# Patient Record
Sex: Female | Born: 1975 | Race: White | Hispanic: No | Marital: Married | Smoking: Current some day smoker
Health system: Southern US, Community
[De-identification: ages and names within clinical notes are randomized; demographics above are authoritative.]

## PROBLEM LIST (undated history)

## (undated) DIAGNOSIS — E119 Type 2 diabetes mellitus without complications: Secondary | ICD-10-CM

## (undated) DIAGNOSIS — IMO0002 Reserved for concepts with insufficient information to code with codable children: Secondary | ICD-10-CM

## (undated) DIAGNOSIS — M797 Fibromyalgia: Secondary | ICD-10-CM

## (undated) DIAGNOSIS — M5430 Sciatica, unspecified side: Secondary | ICD-10-CM

## (undated) DIAGNOSIS — M329 Systemic lupus erythematosus, unspecified: Secondary | ICD-10-CM

## (undated) HISTORY — PX: CHOLECYSTECTOMY: SHX55

## (undated) HISTORY — PX: TONSILLECTOMY: SUR1361

## (undated) HISTORY — PX: ABDOMINAL HYSTERECTOMY: SHX81

## (undated) HISTORY — PX: TUBAL LIGATION: SHX77

---

## 2016-02-13 ENCOUNTER — Emergency Department (HOSPITAL_BASED_OUTPATIENT_CLINIC_OR_DEPARTMENT_OTHER): Payer: 59

## 2016-02-13 ENCOUNTER — Encounter (HOSPITAL_BASED_OUTPATIENT_CLINIC_OR_DEPARTMENT_OTHER): Payer: Self-pay | Admitting: *Deleted

## 2016-02-13 ENCOUNTER — Emergency Department (HOSPITAL_BASED_OUTPATIENT_CLINIC_OR_DEPARTMENT_OTHER)
Admission: EM | Admit: 2016-02-13 | Discharge: 2016-02-13 | Disposition: A | Discharge status: Home / Self Care | Payer: 59 | Attending: Emergency Medicine | Admitting: Emergency Medicine

## 2016-02-13 DIAGNOSIS — F17200 Nicotine dependence, unspecified, uncomplicated: Secondary | ICD-10-CM | POA: Diagnosis not present

## 2016-02-13 DIAGNOSIS — R519 Headache, unspecified: Secondary | ICD-10-CM

## 2016-02-13 DIAGNOSIS — E1165 Type 2 diabetes mellitus with hyperglycemia: Secondary | ICD-10-CM | POA: Diagnosis not present

## 2016-02-13 DIAGNOSIS — Z79899 Other long term (current) drug therapy: Secondary | ICD-10-CM | POA: Diagnosis not present

## 2016-02-13 DIAGNOSIS — Z794 Long term (current) use of insulin: Secondary | ICD-10-CM | POA: Insufficient documentation

## 2016-02-13 DIAGNOSIS — R42 Dizziness and giddiness: Secondary | ICD-10-CM | POA: Diagnosis present

## 2016-02-13 DIAGNOSIS — R739 Hyperglycemia, unspecified: Secondary | ICD-10-CM

## 2016-02-13 DIAGNOSIS — R51 Headache: Secondary | ICD-10-CM

## 2016-02-13 HISTORY — DX: Type 2 diabetes mellitus without complications: E11.9

## 2016-02-13 LAB — CBC WITH DIFFERENTIAL/PLATELET
Basophils Absolute: 0 10*3/uL (ref 0.0–0.1)
Basophils Relative: 0 %
EOS ABS: 0.1 10*3/uL (ref 0.0–0.7)
Eosinophils Relative: 1 %
HEMATOCRIT: 41.3 % (ref 36.0–46.0)
HEMOGLOBIN: 14 g/dL (ref 12.0–15.0)
LYMPHS ABS: 3 10*3/uL (ref 0.7–4.0)
Lymphocytes Relative: 25 %
MCH: 28.8 pg (ref 26.0–34.0)
MCHC: 33.9 g/dL (ref 30.0–36.0)
MCV: 85 fL (ref 78.0–100.0)
Monocytes Absolute: 0.6 10*3/uL (ref 0.1–1.0)
Monocytes Relative: 5 %
NEUTROS ABS: 8 10*3/uL — AB (ref 1.7–7.7)
Neutrophils Relative %: 69 %
Platelets: 343 10*3/uL (ref 150–400)
RBC: 4.86 MIL/uL (ref 3.87–5.11)
RDW: 13.4 % (ref 11.5–15.5)
WBC: 11.7 10*3/uL — AB (ref 4.0–10.5)

## 2016-02-13 LAB — BASIC METABOLIC PANEL
Anion gap: 11 (ref 5–15)
BUN: 11 mg/dL (ref 6–20)
CHLORIDE: 98 mmol/L — AB (ref 101–111)
CO2: 24 mmol/L (ref 22–32)
Calcium: 9.4 mg/dL (ref 8.9–10.3)
Creatinine, Ser: 0.56 mg/dL (ref 0.44–1.00)
GFR calc non Af Amer: >60 mL/min (ref >60)
Glucose, Bld: 395 mg/dL — ABNORMAL HIGH (ref 65–99)
POTASSIUM: 3.7 mmol/L (ref 3.5–5.1)
SODIUM: 133 mmol/L — AB (ref 135–145)

## 2016-02-13 LAB — URINALYSIS, ROUTINE W REFLEX MICROSCOPIC
Bilirubin Urine: NEGATIVE
Glucose, UA: >1000 mg/dL — AB
Hgb urine dipstick: NEGATIVE
Ketones, ur: NEGATIVE mg/dL
LEUKOCYTES UA: NEGATIVE
Nitrite: NEGATIVE
PROTEIN: NEGATIVE mg/dL
Specific Gravity, Urine: 1.043 — ABNORMAL HIGH (ref 1.005–1.030)
pH: 5.5 (ref 5.0–8.0)

## 2016-02-13 LAB — URINE MICROSCOPIC-ADD ON

## 2016-02-13 LAB — CBG MONITORING, ED
GLUCOSE-CAPILLARY: 400 mg/dL — AB (ref 65–99)
GLUCOSE-CAPILLARY: 269 mg/dL — AB (ref 65–99)

## 2016-02-13 LAB — TROPONIN I

## 2016-02-13 MED ORDER — PROMETHAZINE HCL 25 MG/ML IJ SOLN
25.0000 mg | Freq: Once | INTRAMUSCULAR | Status: AC
  Administered 2016-02-13: 25 mg via INTRAVENOUS
  Filled 2016-02-13: qty 1

## 2016-02-13 MED ORDER — INSULIN ASPART 100 UNIT/ML ~~LOC~~ SOLN
10.0000 [IU] | Freq: Once | SUBCUTANEOUS | Status: AC
  Administered 2016-02-13: 10 [IU] via SUBCUTANEOUS
  Filled 2016-02-13: qty 1

## 2016-02-13 MED ORDER — SODIUM CHLORIDE 0.9 % IV BOLUS (SEPSIS)
1000.0000 mL | Freq: Once | INTRAVENOUS | Status: AC
  Administered 2016-02-13: 1000 mL via INTRAVENOUS

## 2016-02-13 NOTE — ED Provider Notes (Signed)
CSN: [id number]     Arrival date & time 02/13/16  1232 History   First MD Initiated Contact with Patient 02/13/16 1318     Chief Complaint  Patient presents with  . Weakness     (Consider location/radiation/quality/duration/timing/severity/associated sxs/prior Treatment) HPI   40 year old female with history of insulin-dependent diabetes presenting with complaint of dizziness and weakness. Patient states today while at work she developed an acute onset of sharp throbbing left-sided headache, lightheadedness, dizziness, break out into sweats, felt nauseous and feeling flushed throughout her body. Symptoms lasting for approximately 10 minutes, improved but not fully resolved. She feels shaky. She felt as if she was going to pass out. She recall heart beating fast.  Patient recall recent changes in her medication including discontinue Neurontin and started on Lyrica. She took her first dose of Lyrica this morning. She takes it for her diabetic neuropathy. She also mentioned that her glucose has not been well-controlled. She usually takes 38 units of insulin at nighttime but her doctor recently told her that she would need to take 45 units nightly. She report remote history of headache but no recurrent headaches. She denies having fever, URI symptoms, shortness of breath, productive cough, dysuria, abdominal pain, focal numbness or weakness, or rash. She endorses a mild cough for the past several days but is nonproductive. Patient is an occasional smoker, denies alcohol abuse.  Past Medical History  Diagnosis Date  . Diabetes mellitus without complication [facility](HCC)    Past Surgical History  Procedure Laterality Date  . Abdominal hysterectomy    . Cholecystectomy    . Tubal ligation    . Tonsillectomy     No family history on file. Social History  Substance Use Topics  . Smoking status: Current Some Day Smoker  . Smokeless tobacco: None  . Alcohol Use: No   OB History    No data available      Review of Systems  All other systems reviewed and are negative.     Allergies  Monistat and Morphine and related  Home Medications   Prior to Admission medications   Medication Sig Start Date End Date Taking? Authorizing Provider  ibuprofen (ADVIL,MOTRIN) 800 MG tablet Take 800 mg by mouth every 8 (eight) hours as needed.   Yes Historical Provider, MD  insulin glargine (LANTUS) 100 UNIT/ML injection Inject into the skin at bedtime.   Yes Historical Provider, MD  Pregabalin (LYRICA PO) Take by mouth.   Yes Historical Provider, MD   BP 130/82 mmHg  Pulse 81  Temp(Src) 98 F (36.7 C) (Oral)  Resp 18  Ht 5\' 4"  (1.626 m)  Wt 99.791 kg  BMI 37.74 kg/m2  SpO2 95% Physical Exam  Constitutional: She is oriented to person, place, and time. She appears well-developed and well-nourished. No distress.  Obese Caucasian female laying in bed appears uncomfortable but nontoxic.  HENT:  Head: Atraumatic.  Right Ear: External ear normal.  Left Ear: External ear normal.  Mouth/Throat: Oropharynx is clear and moist.  Eyes: Conjunctivae and EOM are normal. Pupils are equal, round, and reactive to light.  Neck: Normal range of motion. Neck supple.  No nuchal rigidity  Cardiovascular: Normal rate and regular rhythm.   Pulmonary/Chest: Effort normal and breath sounds normal.  Abdominal: Soft. Bowel sounds are normal. She exhibits no distension. There is no tenderness.  Neurological: She is alert and oriented to person, place, and time. She has normal strength. No cranial nerve deficit or sensory deficit. She displays a negative  Romberg sign. Coordination normal. GCS eye subscore is 4. GCS verbal subscore is 5. GCS motor subscore is 6.  Skin: No rash noted.  Psychiatric: She has a normal mood and affect.  Nursing note and vitals reviewed.   ED Course  Procedures (including critical care time) Labs Review Labs Reviewed  CBC WITH DIFFERENTIAL/PLATELET - Abnormal; Notable for the following:     WBC 11.7 (*)    Neutro Abs 8.0 (*)    All other components within normal limits  BASIC METABOLIC PANEL - Abnormal; Notable for the following:    Sodium 133 (*)    Chloride 98 (*)    Glucose, Bld 395 (*)    All other components within normal limits  URINALYSIS, ROUTINE W REFLEX MICROSCOPIC (NOT [facility]) - Abnormal; Notable for the following:    Specific Gravity, Urine 1.043 (*)    Glucose, UA >1000 (*)    All other components within normal limits  URINE MICROSCOPIC-ADD ON - Abnormal; Notable for the following:    Squamous Epithelial / LPF 6-30 (*)    Bacteria, UA FEW (*)    All other components within normal limits  CBG MONITORING, ED - Abnormal; Notable for the following:    Glucose-Capillary 400 (*)    All other components within normal limits  CBG MONITORING, ED - Abnormal; Notable for the following:    Glucose-Capillary 269 (*)    All other components within normal limits  TROPONIN I    Imaging Review Ct Head Wo Contrast  02/13/2016  CLINICAL DATA:  Near syncopal episode today. Dizziness. Increased heart rate. Headache. EXAM: CT HEAD WITHOUT CONTRAST TECHNIQUE: Contiguous axial images were obtained from the base of the skull through the vertex without intravenous contrast. COMPARISON:  None. FINDINGS: No acute cortical infarct, hemorrhage, or mass lesion is present. The ventricles are of normal size. No significant extra-axial fluid collection is evident. The paranasal sinuses and mastoid air cells are clear. The calvarium is intact. The globes and orbits are within normal limits. No significant extracranial soft tissue lesions are present. IMPRESSION: Negative CT of the head. Electronically Signed   By: Marin Robertshristopher  Mattern M.D.   On: 02/13/2016 14:32   I have personally reviewed and evaluated these images and lab results as part of my medical decision-making.   EKG Interpretation   Date/Time:  Friday February 13 2016 12:47:22 EDT Ventricular Rate:  79 PR Interval:    QRS  Duration: 103 QT Interval:  368 QTC Calculation: 422 R Axis:   16 Text Interpretation:  Sinus rhythm EKG WITHIN NORMAL LIMITS No previous  ECGs available Confirmed by LITTLE MD, RACHEL [phone]([id number]) on 02/13/2016 2:51:39  PM      MDM   Final diagnoses:  Bad headache  Hyperglycemia    BP 130/82 mmHg  Pulse 81  Temp(Src) 98 F (36.7 C) (Oral)  Resp 18  Ht 5\' 4"  (1.626 m)  Wt 99.791 kg  BMI 37.74 kg/m2  SpO2 95%   1:57 PM Patient presents with acute onset of headache, dizziness and generalized fatigue. She has no focal neuro deficit on exam. She did not describe thunderclap headache however due to her presenting complaint, I will obtain a screening head CT scan to rule out hemorrhagic event. She has history of diabetes, her glucose is 400 with normal anion gap and electrolytes panels are reassuring. Will give IV fluid and insulin here. We'll perform a cardiac screening tests. Plan to monitor closely.  3:23 PM EKG is unremarkable. Negative troponin. My  suspicion for ACS event is low.  4:03 PM Patient's initial CBG was 400. It has improved to 269 after patient received 10 units of insulin along with 1 L of normal saline. UA shows no signs of ketones or signs of urinary tract infection. Her labs are otherwise reassuring. Her CT scan is unremarkable. EKG without acute ischemic changes.  I encouraged patient to follow-up probably with a primary care provider for further evaluation of her condition. She may need to discuss if Lyrica is appropriate for her as this could potentiate some of her symptoms. I also encouraged patient to increase her  basal insulin dose from 38 to 45 units as previously prescribed by her primary care provider. Patient to return if her condition worsen. Care discussed with Dr. Clarene Duke.   Fayrene Helper, PA-C 02/13/16 1610  Laurence Spates, MD 02/16/16 [phone]

## 2016-02-13 NOTE — ED Notes (Signed)
Pt reports sudden onset of dizziness while at work earlier. Sts that she became nauseated as well. Pt reports she only takes long lasting insulin at night. Reports it was increased today.

## 2016-02-13 NOTE — ED Notes (Signed)
Sudden onset dizziness. She felt like she was going to pass out. Felt her body get hot and her heart beating fast. Headache and nausea on arrival.

## 2016-02-13 NOTE — ED Notes (Signed)
Walked patient in hall to restroom and back. Patient walked with slow and steady gait holding onto IV pole

## 2016-02-13 NOTE — Discharge Instructions (Signed)
Your symptoms will need to be evaluated further by your primary care provider.  Please discuss if Lyrica is right for you.  Your blood sugar is elevated today, increase the dose of your insulin as previously recommended by your doctor.  Return to the ER if your condition worsen or if you have other concerns.   Blood Glucose Monitoring, Adult Monitoring your blood glucose (also know as blood sugar) helps you to manage your diabetes. It also helps you and your health care provider monitor your diabetes and determine how well your treatment plan is working. WHY SHOULD YOU MONITOR YOUR BLOOD GLUCOSE?  It can help you understand how food, exercise, and medicine affect your blood glucose.  It allows you to know what your blood glucose is at any given moment. You can quickly tell if you are having low blood glucose (hypoglycemia) or high blood glucose (hyperglycemia).  It can help you and your health care provider know how to adjust your medicines.  It can help you understand how to manage an illness or adjust medicine for exercise. WHEN SHOULD YOU TEST? Your health care provider will help you decide how often you should check your blood glucose. This may depend on the type of diabetes you have, your diabetes control, or the types of medicines you are taking. Be sure to write down all of your blood glucose readings so that this information can be reviewed with your health care provider. See below for examples of testing times that your health care provider may suggest. Type 1 Diabetes  Test at least 2 times per day if your diabetes is well controlled, if you are using an insulin pump, or if you perform multiple daily injections.  If your diabetes is not well controlled or if you are sick, you may need to test more often.  It is a good idea to also test:  Before every insulin injection.  Before and after exercise.  Between meals and 2 hours after a meal.  Occasionally between 2:00 a.m. and 3:00  a.m. Type 2 Diabetes  If you are taking insulin, test at least 2 times per day. However, it is best to test before every insulin injection.  If you take medicines by mouth (orally), test 2 times a day.  If you are on a controlled diet, test once a day.  If your diabetes is not well controlled or if you are sick, you may need to monitor more often. HOW TO MONITOR YOUR BLOOD GLUCOSE Supplies Needed  Blood glucose meter.  Test strips for your meter. Each meter has its own strips. You must use the strips that go with your own meter.  A pricking needle (lancet).  A device that holds the lancet (lancing device).  A journal or log book to write down your results. Procedure  Wash your hands with soap and water. Alcohol is not preferred.  Prick the side of your finger (not the tip) with the lancet.  Gently milk the finger until a small drop of blood appears.  Follow the instructions that come with your meter for inserting the test strip, applying blood to the strip, and using your blood glucose meter. Other Areas to Get Blood for Testing Some meters allow you to use other areas of your body (other than your finger) to test your blood. These areas are called alternative sites. The most common alternative sites are:  The forearm.  The thigh.  The back area of the lower leg.  The palm of  the hand. The blood flow in these areas is slower. Therefore, the blood glucose values you get may be delayed, and the numbers are different from what you would get from your fingers. Do not use alternative sites if you think you are having hypoglycemia. Your reading will not be accurate. Always use a finger if you are having hypoglycemia. Also, if you cannot feel your lows (hypoglycemia unawareness), always use your fingers for your blood glucose checks. ADDITIONAL TIPS FOR GLUCOSE MONITORING  Do not reuse lancets.  Always carry your supplies with you.  All blood glucose meters have a 24-hour  "hotline" number to call if you have questions or need help.  Adjust (calibrate) your blood glucose meter with a control solution after finishing a few boxes of strips. BLOOD GLUCOSE RECORD KEEPING It is a good idea to keep a daily record or log of your blood glucose readings. Most glucose meters, if not all, keep your glucose records stored in the meter. Some meters come with the ability to download your records to your home computer. Keeping a record of your blood glucose readings is especially helpful if you are wanting to look for patterns. Make notes to go along with the blood glucose readings because you might forget what happened at that exact time. Keeping good records helps you and your health care provider to work together to achieve good diabetes management.    This information is not intended to replace advice given to you by your health care provider. Make sure you discuss any questions you have with your health care provider.   Document Released: 08/05/2003 Document Revised: 08/23/2014 Document Reviewed: 12/25/2012 Elsevier Interactive Patient Education [employer]2016 Else[employer] Inc.

## 2016-03-17 ENCOUNTER — Encounter (HOSPITAL_BASED_OUTPATIENT_CLINIC_OR_DEPARTMENT_OTHER): Payer: Self-pay

## 2016-03-17 ENCOUNTER — Emergency Department (HOSPITAL_BASED_OUTPATIENT_CLINIC_OR_DEPARTMENT_OTHER)
Admission: EM | Admit: 2016-03-17 | Discharge: 2016-03-17 | Disposition: A | Discharge status: Home / Self Care | Payer: 59 | Attending: Physician Assistant | Admitting: Physician Assistant

## 2016-03-17 DIAGNOSIS — R51 Headache: Secondary | ICD-10-CM | POA: Insufficient documentation

## 2016-03-17 DIAGNOSIS — R197 Diarrhea, unspecified: Secondary | ICD-10-CM | POA: Insufficient documentation

## 2016-03-17 DIAGNOSIS — K0889 Other specified disorders of teeth and supporting structures: Secondary | ICD-10-CM | POA: Insufficient documentation

## 2016-03-17 DIAGNOSIS — Z79899 Other long term (current) drug therapy: Secondary | ICD-10-CM | POA: Diagnosis not present

## 2016-03-17 DIAGNOSIS — E119 Type 2 diabetes mellitus without complications: Secondary | ICD-10-CM | POA: Diagnosis not present

## 2016-03-17 DIAGNOSIS — F17200 Nicotine dependence, unspecified, uncomplicated: Secondary | ICD-10-CM | POA: Diagnosis not present

## 2016-03-17 DIAGNOSIS — Z794 Long term (current) use of insulin: Secondary | ICD-10-CM | POA: Diagnosis not present

## 2016-03-17 DIAGNOSIS — R110 Nausea: Secondary | ICD-10-CM | POA: Insufficient documentation

## 2016-03-17 DIAGNOSIS — H9201 Otalgia, right ear: Secondary | ICD-10-CM | POA: Insufficient documentation

## 2016-03-17 HISTORY — DX: Sciatica, unspecified side: M54.30

## 2016-03-17 MED ORDER — ACETAMINOPHEN 325 MG PO TABS
650.0000 mg | ORAL_TABLET | Freq: Once | ORAL | Status: AC
  Administered 2016-03-17: 650 mg via ORAL
  Filled 2016-03-17: qty 2

## 2016-03-17 MED ORDER — NEOMYCIN-POLYMYXIN-HC 1 % OT SOLN: 3.0000 [drp] | Freq: Four times a day (QID) | OTIC | 0 refills | Status: AC

## 2016-03-17 MED ORDER — AMOXICILLIN-POT CLAVULANATE 875-125 MG PO TABS: 1.0000 | ORAL_TABLET | Freq: Two times a day (BID) | ORAL | 0 refills | Status: DC

## 2016-03-17 NOTE — ED Notes (Signed)
Pa  at bedside. 

## 2016-03-17 NOTE — ED Triage Notes (Signed)
C/o right earache x 3 days-also c/o "filling fell out of tooth" today while eating

## 2016-03-17 NOTE — Discharge Instructions (Signed)
Read the information below.   You are being prescribed an antibiotic drop for your ear. Take as directed. You are being prescribed an oral antibiotic for your broken filling to prevent infection. Take as directed.  You can take tylenol or motrin for pain relief.  It is important that you follow up with a dentist ASAP.  Use the prescribed medication as directed.   I have also provided the contact information for a primary care doctor if you do not have one. Be sure to call and schedule a follow up appointment.  Please discuss all new medications with your pharmacist.  You may return to the Emergency Department at any time for worsening condition or any new symptoms that concern you. Return to the ED if you develop fever, difficulty swallowing, difficulty breathing.

## 2016-03-17 NOTE — ED Provider Notes (Signed)
[facility] Provider Note   CSN: [id number] Arrival date & time: 03/17/16  1255  First Provider Contact:  First MD Initiated Contact with Patient 03/17/16 1325        History   Chief Complaint Chief Complaint  Patient presents with  . Otalgia    HPI Jennifer Beltran is a 40 y.o. female.  Jennifer Beltran is a 41 y.o. female  with history of diabetes and migraines resents to ED with 2 complaints. Patient states she had right otalgia 3 days. Describes it as a stabbing sensation in her right ear. She has tried using antibiotic drops that she had from an earlier episode of otitis externa with no relief. She reports swimming in a pool approximately one week ago. She denies any trauma to ear. She has associated headache. No URI symptoms. No watery eyes. No cough. She also reports that her filling on her right lower molar broke today after eating an apple. She endorses dental pain. She is tried taking her pain medication with minimal relief. She denies fever, trouble swallowing, trouble breathing.      Past Medical History:  Diagnosis Date  . Diabetes mellitus without complication (HCC)   . Sciatica     There are no active problems to display for this patient.   Past Surgical History:  Procedure Laterality Date  . ABDOMINAL HYSTERECTOMY    . CHOLECYSTECTOMY    . TONSILLECTOMY    . TUBAL LIGATION      OB History    No data available       Home Medications    Prior to Admission medications   Medication Sig Start Date End Date Taking? Authorizing Provider  gabapentin (NEURONTIN) 600 MG tablet Take 1,200 mg by mouth 3 (three) times daily.   Yes Historical Provider, MD  glipiZIDE (GLUCOTROL) 5 MG tablet Take by mouth daily before breakfast.   Yes Historical Provider, MD  amoxicillin-clavulanate (AUGMENTIN) 875-125 MG tablet Take 1 tablet by mouth every 12 (twelve) hours. 03/17/16   Lona Kettle, PA-C  ibuprofen (ADVIL,MOTRIN) 800 MG tablet Take 800 mg by  mouth every 8 (eight) hours as needed.    Historical Provider, MD  insulin glargine (LANTUS) 100 UNIT/ML injection Inject into the skin at bedtime.    Historical Provider, MD  NEOMYCIN-POLYMYXIN-HYDROCORTISONE (CORTISPORIN) 1 % SOLN otic solution Place 3 drops into the right ear 4 (four) times daily. For 7 days 03/17/16 03/24/16  Lona Kettle, PA-C    Family History No family history on file.  Social History Social History  Substance Use Topics  . Smoking status: Current Some Day Smoker  . Smokeless tobacco: Never Used  . Alcohol use No     Allergies   Monistat [tioconazole] and Morphine and related   Review of Systems Review of Systems  Constitutional: Negative for fever.  HENT: Positive for dental problem and ear pain. Negative for congestion, postnasal drip, rhinorrhea and trouble swallowing.   Eyes: Negative for discharge.  Respiratory: Negative for cough and shortness of breath.   Gastrointestinal: Positive for diarrhea and nausea. Negative for abdominal pain and vomiting.  Neurological: Positive for headaches.     Physical Exam Updated Vital Signs BP 129/80 (BP Location: Left Arm)   Pulse 90   Temp 98.4 F (36.9 C) (Oral)   Resp 18   Ht 5\' 4"  (1.626 m)   Wt 103 kg   SpO2 95%   BMI 38.96 kg/m   Physical Exam  Constitutional: She appears well-developed and  well-nourished. No distress.  HENT:  Head: Normocephalic and atraumatic.  Right Ear: No lacerations. There is tenderness. No foreign bodies. No mastoid tenderness. Tympanic membrane is scarred. Tympanic membrane is not injected, not perforated and not erythematous. No middle ear effusion. No hemotympanum.  Left Ear: Tympanic membrane, external ear and ear canal normal. No mastoid tenderness.  Nose: Rhinorrhea present.  Mouth/Throat: Uvula is midline, oropharynx is clear and moist and mucous membranes are normal. No oral lesions. No trismus in the jaw. Abnormal dentition. Dental caries present. No uvula  swelling. No oropharyngeal exudate, posterior oropharyngeal edema or posterior oropharyngeal erythema.  TTP of right tragus and pinna. Superficial abrasion and mild erythema and scant white cerumen noted in external canal of right ear. Right TM scarring noted, no erythema, injection, or bulging. No erythema, warmth, or tenderness of mastoid noted.  Edentulous. Partial dental filling and tooth at #30 present. No movement of tooth, no avulsion noted. Partial dental filling and tooth present at #13, no movement of tooth, no avulsion. No gingival swelling, redness, fluctuance, or discharge noted. No obvious abscess.   Eyes: Conjunctivae are normal. Pupils are equal, round, and reactive to light. Right eye exhibits no discharge. Left eye exhibits no discharge. No scleral icterus.  Neck: Normal range of motion.  Cardiovascular: Normal rate.   Pulmonary/Chest: Effort normal. No stridor. No respiratory distress.  Abdominal: Soft. There is no tenderness. There is no guarding.  Neurological: She is alert.  Skin: Skin is warm and dry. She is not diaphoretic.  Psychiatric: She has a normal mood and affect. Her behavior is normal.     ED Treatments / Results  Labs (all labs ordered are listed, but only abnormal results are displayed) Labs Reviewed - No data to display  EKG  EKG Interpretation None       Radiology No results found.  Procedures Procedures (including critical care time)  Medications Ordered in ED Medications  acetaminophen (TYLENOL) tablet 650 mg (650 mg Oral Given 03/17/16 1347)     Initial Impression / Assessment and Plan / ED Course  I have reviewed the triage vital signs and the nursing notes.  Pertinent labs & imaging results that were available during my care of the patient were reviewed by me and considered in my medical decision making (see chart for details).  Clinical Course    Patient is afebrile and non-toxic appearing in obvious discomfort. Physical exam  remarkable for TTP of right tragus and pinna, with mild erythema and scant white cerumen in ear canal. With h/o swimming in a pool and DM along with physical exam findings will treat for otitis externa. No canal occlusion. Exam non-concerning for mastoiditis, cellulitis, or malignant OE. Rx polymixin drops, d/c ciprodex. Follow up with PCP if symptoms do note improve in 2-3 days.   Regarding dentalgia - physical exam remarkable for partial dental filling and tooth at #30 and #13 missing. No avulsion noted. No trismus. No oral swelling. No gingival swelling, redness, or fluctuance. No drainable abscess present. Low suspicion for soft tissue infection. Will treat with PO ABX for prophylaxis against infection. Dental resources provided and encouraged follow up with dentist ASAP.    Discussed symptomatic management to include motrin or tylenol for pain relief. Return precautions discussed. Patient voiced understanding and is agreeable.     Final Clinical Impressions(s) / ED Diagnoses   Final diagnoses:  Otalgia, right  Dentalgia    New Prescriptions Discharge Medication List as of 03/17/2016  2:02 PM  START taking these medications   Details  amoxicillin-clavulanate (AUGMENTIN) 875-125 MG tablet Take 1 tablet by mouth every 12 (twelve) hours., Starting Wed 03/17/2016, Print    NEOMYCIN-POLYMYXIN-HYDROCORTISONE (CORTISPORIN) 1 % SOLN otic solution Place 3 drops into the right ear 4 (four) times daily. For 7 days, Starting Wed 03/17/2016, Until Wed 03/24/2016, Print         Lona Kettle, [place] 03/17/16 2043    Courteney Randall An, MD 03/18/16 [phone]

## 2016-07-24 ENCOUNTER — Encounter (HOSPITAL_BASED_OUTPATIENT_CLINIC_OR_DEPARTMENT_OTHER): Payer: Self-pay | Admitting: Emergency Medicine

## 2016-07-24 ENCOUNTER — Emergency Department (HOSPITAL_BASED_OUTPATIENT_CLINIC_OR_DEPARTMENT_OTHER)
Admission: EM | Admit: 2016-07-24 | Discharge: 2016-07-24 | Disposition: A | Discharge status: Home / Self Care | Payer: 59 | Attending: Emergency Medicine | Admitting: Emergency Medicine

## 2016-07-24 DIAGNOSIS — E119 Type 2 diabetes mellitus without complications: Secondary | ICD-10-CM | POA: Diagnosis not present

## 2016-07-24 DIAGNOSIS — Z79899 Other long term (current) drug therapy: Secondary | ICD-10-CM | POA: Insufficient documentation

## 2016-07-24 DIAGNOSIS — N3000 Acute cystitis without hematuria: Secondary | ICD-10-CM

## 2016-07-24 DIAGNOSIS — Z794 Long term (current) use of insulin: Secondary | ICD-10-CM | POA: Insufficient documentation

## 2016-07-24 DIAGNOSIS — F17200 Nicotine dependence, unspecified, uncomplicated: Secondary | ICD-10-CM | POA: Insufficient documentation

## 2016-07-24 DIAGNOSIS — K0889 Other specified disorders of teeth and supporting structures: Secondary | ICD-10-CM

## 2016-07-24 LAB — BASIC METABOLIC PANEL
Anion gap: 7 (ref 5–15)
BUN: 8 mg/dL (ref 6–20)
CALCIUM: 8.8 mg/dL — AB (ref 8.9–10.3)
CO2: 27 mmol/L (ref 22–32)
CREATININE: 0.44 mg/dL (ref 0.44–1.00)
Chloride: 100 mmol/L — ABNORMAL LOW (ref 101–111)
GFR calc non Af Amer: >60 mL/min (ref >60)
Glucose, Bld: 258 mg/dL — ABNORMAL HIGH (ref 65–99)
Potassium: 3.4 mmol/L — ABNORMAL LOW (ref 3.5–5.1)
SODIUM: 134 mmol/L — AB (ref 135–145)

## 2016-07-24 LAB — CBC WITH DIFFERENTIAL/PLATELET
BASOS PCT: 0 %
Basophils Absolute: 0 10*3/uL (ref 0.0–0.1)
EOS ABS: 0.1 10*3/uL (ref 0.0–0.7)
EOS PCT: 1 %
HCT: 41.7 % (ref 36.0–46.0)
HEMOGLOBIN: 14.1 g/dL (ref 12.0–15.0)
Lymphocytes Relative: 21 %
Lymphs Abs: 1.7 10*3/uL (ref 0.7–4.0)
MCH: 28.6 pg (ref 26.0–34.0)
MCHC: 33.8 g/dL (ref 30.0–36.0)
MCV: 84.6 fL (ref 78.0–100.0)
MONOS PCT: 7 %
Monocytes Absolute: 0.6 10*3/uL (ref 0.1–1.0)
NEUTROS PCT: 71 %
Neutro Abs: 5.6 10*3/uL (ref 1.7–7.7)
PLATELETS: 278 10*3/uL (ref 150–400)
RBC: 4.93 MIL/uL (ref 3.87–5.11)
RDW: 13.2 % (ref 11.5–15.5)
WBC: 8 10*3/uL (ref 4.0–10.5)

## 2016-07-24 LAB — URINALYSIS, ROUTINE W REFLEX MICROSCOPIC
BILIRUBIN URINE: NEGATIVE
Glucose, UA: >=500 mg/dL — AB
HGB URINE DIPSTICK: NEGATIVE
KETONES UR: 15 mg/dL — AB
Leukocytes, UA: NEGATIVE
NITRITE: NEGATIVE
PROTEIN: NEGATIVE mg/dL
SPECIFIC GRAVITY, URINE: 1.038 — AB (ref 1.005–1.030)
pH: 6 (ref 5.0–8.0)

## 2016-07-24 LAB — URINALYSIS, MICROSCOPIC (REFLEX)

## 2016-07-24 LAB — CBG MONITORING, ED: Glucose-Capillary: 263 mg/dL — ABNORMAL HIGH (ref 65–99)

## 2016-07-24 MED ORDER — SODIUM CHLORIDE 0.9 % IV BOLUS (SEPSIS)
1000.0000 mL | Freq: Once | INTRAVENOUS | Status: AC
  Administered 2016-07-24: 1000 mL via INTRAVENOUS

## 2016-07-24 MED ORDER — CLINDAMYCIN HCL 150 MG PO CAPS: 300.0000 mg | ORAL_CAPSULE | Freq: Three times a day (TID) | ORAL | 0 refills | Status: DC

## 2016-07-24 MED ORDER — TRAMADOL HCL 50 MG PO TABS: 50.0000 mg | ORAL_TABLET | Freq: Four times a day (QID) | ORAL | 0 refills | Status: DC | PRN

## 2016-07-24 MED ORDER — CEPHALEXIN 500 MG PO CAPS: 500.0000 mg | ORAL_CAPSULE | Freq: Two times a day (BID) | ORAL | 0 refills | Status: DC

## 2016-07-24 MED ORDER — ONDANSETRON HCL 4 MG/2ML IJ SOLN
4.0000 mg | Freq: Once | INTRAMUSCULAR | Status: AC
Start: 2016-07-24 — End: 2016-07-24
  Administered 2016-07-24: 4 mg via INTRAVENOUS
  Filled 2016-07-24: qty 2

## 2016-07-24 MED ORDER — HYDROCODONE-ACETAMINOPHEN 5-325 MG PO TABS: 2.0000 | ORAL_TABLET | ORAL | 0 refills | Status: DC | PRN

## 2016-07-24 MED ORDER — CLINDAMYCIN HCL 150 MG PO CAPS
300.0000 mg | ORAL_CAPSULE | Freq: Once | ORAL | Status: AC
  Administered 2016-07-24: 300 mg via ORAL
  Filled 2016-07-24: qty 2

## 2016-07-24 MED ORDER — CEPHALEXIN 250 MG PO CAPS
500.0000 mg | ORAL_CAPSULE | Freq: Once | ORAL | Status: AC
  Administered 2016-07-24: 500 mg via ORAL
  Filled 2016-07-24: qty 2

## 2016-07-24 MED ORDER — KETOROLAC TROMETHAMINE 30 MG/ML IJ SOLN
15.0000 mg | Freq: Once | INTRAMUSCULAR | Status: AC
Start: 2016-07-24 — End: 2016-07-24
  Administered 2016-07-24: 15 mg via INTRAVENOUS
  Filled 2016-07-24: qty 1

## 2016-07-24 NOTE — ED Notes (Signed)
Pt teaching provided on medications that may cause drowsiness. Pt instructed not to drive or operate heavy machinery while taking the prescribed medication. Pt verbalized understanding.   

## 2016-07-24 NOTE — ED Provider Notes (Signed)
[facility] Provider Note   CSN: [id number] Arrival date & time: 07/24/16  1750  By signing my name below, I, Octavia Heir, attest that this documentation has been prepared under the direction and in the presence of Rise Mu, PA-C.  Electronically Signed: Octavia Heir, ED Scribe. 07/24/16. 6:31 PM.    History   Chief Complaint Chief Complaint  Patient presents with  . Dental Pain    The history is provided by the patient. No language interpreter was used.   HPI Comments: Jennifer Beltran is a 40 y.o. female who has a PMhx of DM presents to the Emergency Department complaining of constant, gradual worsening, severe right dental pain x 5 days. She reports associated headache, nausea, fever (tmax 101), chills, pain with swallowing, and right sided facial swelling. Pt was seen at [facility] ED on 12/07 for the same pain and treated for a dental abscess with azithromycin and hydrocodone. She has been taking the medications prescribed with minimal relief. She has taken goodie powders for her headache and fever with relief. Pt also notes that her CBG has been higher since she has not been feeling well. States her bs this am was 240's.. Pt is on 45 mL of nighttime Lantus. She denies vision changes, neck stiffness, trismus, Lightheadedness, dizziness, chest pain, shortness of breath, abdominal pain, nausea, emesis, urinary symptoms, change in bowel habits, numbness/tingling  Past Medical History:  Diagnosis Date  . Diabetes mellitus without complication (HCC)   . Sciatica     There are no active problems to display for this patient.   Past Surgical History:  Procedure Laterality Date  . ABDOMINAL HYSTERECTOMY    . CHOLECYSTECTOMY    . TONSILLECTOMY    . TUBAL LIGATION      OB History    No data available       Home Medications    Prior to Admission medications   Medication Sig Start Date End Date Taking? Authorizing Provider  amoxicillin-clavulanate  (AUGMENTIN) 875-125 MG tablet Take 1 tablet by mouth every 12 (twelve) hours. 03/17/16   Lona Kettle, PA-C  gabapentin (NEURONTIN) 600 MG tablet Take 1,200 mg by mouth 3 (three) times daily.    Historical Provider, MD  glipiZIDE (GLUCOTROL) 5 MG tablet Take by mouth daily before breakfast.    Historical Provider, MD  ibuprofen (ADVIL,MOTRIN) 800 MG tablet Take 800 mg by mouth every 8 (eight) hours as needed.    Historical Provider, MD  insulin glargine (LANTUS) 100 UNIT/ML injection Inject into the skin at bedtime.    Historical Provider, MD    Family History History reviewed. No pertinent family history.  Social History Social History  Substance Use Topics  . Smoking status: Current Some Day Smoker  . Smokeless tobacco: Never Used  . Alcohol use No     Allergies   Monistat [tioconazole]; Morphine and related; and Penicillins   Review of Systems Review of Systems  Constitutional: Positive for chills and fever.  HENT: Positive for dental problem and facial swelling. Negative for sore throat, trouble swallowing and voice change.   Respiratory: Negative for shortness of breath.   Gastrointestinal: Positive for nausea. Negative for abdominal pain and vomiting.  Musculoskeletal: Negative for neck pain and neck stiffness.  Neurological: Positive for headaches. Negative for dizziness, syncope, light-headedness and numbness.  All other systems reviewed and are negative.    Physical Exam Updated Vital Signs BP 116/71 (BP Location: Right Arm)   Pulse 96   Temp 98.3  F (36.8 C) (Oral)   Resp 18   Ht 5\' 4"  (1.626 m)   Wt 230 lb (104.3 kg)   SpO2 94%   BMI 39.48 kg/m   Physical Exam  Constitutional: She is oriented to person, place, and time. She appears well-developed and well-nourished. No distress.  Appears to be in mild discomfort due to pain. Patient speaking in complete sentences and maintain her secretions. Patient appears tearful and anxious   HENT:  Head:  Normocephalic and atraumatic.  Right Ear: Tympanic membrane, external ear and ear canal normal.  Left Ear: Tympanic membrane, external ear and ear canal normal.  Nose: Nose normal.  Mouth/Throat: Uvula is midline, oropharynx is clear and moist and mucous membranes are normal.    Mild soft facial swelling to right lower jaw. No erythema or warmth. No sublingual or submandibular swelling. The area is not indurated or fluctuant. No trismus noted. Oropharynx is clear.  Eyes: Conjunctivae are normal. Pupils are equal, round, and reactive to light. Right eye exhibits no discharge. Left eye exhibits no discharge. No scleral icterus.  Neck: Normal range of motion. Neck supple. No thyromegaly present.  Full range of motion without any neck stiffness. No cervical lymphadenopathy.  Cardiovascular: Normal rate, regular rhythm, normal heart sounds and intact distal pulses.  Exam reveals no gallop and no friction rub.   No murmur heard. Pulmonary/Chest: Effort normal and breath sounds normal. No respiratory distress.  Abdominal: Soft. Bowel sounds are normal. She exhibits no distension. There is no tenderness. There is no rebound and no guarding.  Musculoskeletal: Normal range of motion.  Lymphadenopathy:    She has no cervical adenopathy.  Neurological: She is alert and oriented to person, place, and time.  Skin: Skin is warm and dry. Capillary refill takes less than 2 seconds.  Nursing note and vitals reviewed.    ED Treatments / Results  DIAGNOSTIC STUDIES: Oxygen Saturation is 94% on RA, adequate by my interpretation.  COORDINATION OF CARE:  6:29 PM Discussed treatment plan with pt at bedside and pt agreed to plan.  Labs (all labs ordered are listed, but only abnormal results are displayed) Labs Reviewed  BASIC METABOLIC PANEL - Abnormal; Notable for the following:       Result Value   Sodium 134 (*)    Potassium 3.4 (*)    Chloride 100 (*)    Glucose, Bld 258 (*)    Calcium 8.8 (*)     All other components within normal limits  URINALYSIS, ROUTINE W REFLEX MICROSCOPIC - Abnormal; Notable for the following:    Specific Gravity, Urine 1.038 (*)    Glucose, UA >=500 (*)    Ketones, ur 15 (*)    All other components within normal limits  URINALYSIS, MICROSCOPIC (REFLEX) - Abnormal; Notable for the following:    Bacteria, UA MANY (*)    Squamous Epithelial / LPF 6-30 (*)    All other components within normal limits  CBG MONITORING, ED - Abnormal; Notable for the following:    Glucose-Capillary 263 (*)    All other components within normal limits  URINE CULTURE  CBC WITH DIFFERENTIAL/PLATELET    EKG  EKG Interpretation None       Radiology No results found.  Procedures Procedures (including critical care time)  Medications Ordered in ED Medications  sodium chloride 0.9 % bolus 1,000 mL (0 mLs Intravenous Stopped 07/24/16 1945)  ketorolac (TORADOL) 30 MG/ML injection 15 mg (15 mg Intravenous Given 07/24/16 1946)  ondansetron (ZOFRAN) injection  4 mg (4 mg Intravenous Given 07/24/16 2021)  sodium chloride 0.9 % bolus 1,000 mL (0 mLs Intravenous Stopped 07/24/16 2108)  clindamycin (CLEOCIN) capsule 300 mg (300 mg Oral Given 07/24/16 2122)  cephALEXin (KEFLEX) capsule 500 mg (500 mg Oral Given 07/24/16 2122)     Initial Impression / Assessment and Plan / ED Course  I have reviewed the triage vital signs and the nursing notes.  Pertinent labs & imaging results that were available during my care of the patient were reviewed by me and considered in my medical decision making (see chart for details).  Clinical Course   Patient presents to the ED today with right dental pain and right facial swelling. Patient was seen in the ED approximately 3 days ago and given a prescription for azithromycin for her dental pain and told to follow-up with a dentist. She has been unable to do so. Return to the ED for facial swelling and worsening pain. Patient states that her blood sugars  have been running in the 240s. Here her blood sugar was 263. Anon gap is normal. Only a small amount ketones were noted in urine. Patient denies any vomiting or abd pain. Low suspicion for DKA. Given to liters of fluids. Patient is without any leukocytosis. Urine with many bacteria and squamous epithelia. No nitrites or leukocytes. Patient endorses urgency and frequency. Feel this is contaminated sample, however given patient urinary symptoms will treat with with abx. Culture pending. All other labs are unremarkable. Patient with toothache.  No gross abscess.  Exam unconcerning for Ludwig's angina or spread of infection. Will switch abx to clindamycin from z pack and continue pain medicine. Urged patient to follow-up with dentist and have given a dental referral guide. Pt is hemodynamically stable, in NAD, & able to ambulate in the ED. Pain has been managed & has no complaints prior to dc. Pt is comfortable with above plan and is stable for discharge at this time. All questions were answered prior to disposition. Strict return precautions for f/u to the ED were discussed including worsening swelling or fevers. Patient was seen an examined by Dr. Criss AlvineGoldston who is agreeable to the above plan.   I personally performed the services described in this documentation, which was scribed in my presence. The recorded information has been reviewed and is accurate.  Final Clinical Impressions(s) / ED Diagnoses   Final diagnoses:  Pain, dental  Acute cystitis without hematuria    New Prescriptions New Prescriptions   No medications on file     Rise MuKenneth T Angell Pincock, PA-C 07/26/16 1459    Pricilla LovelessScott Goldston, MD 07/26/16 1557

## 2016-07-24 NOTE — ED Triage Notes (Signed)
Patient reports that she was at the MD's wed for a right sided dental pain. The patient reports that she is unable to eat or drink now and is having a possible high blood sugar.

## 2016-07-24 NOTE — ED Notes (Signed)
Pt is being treated for dental abscess with Z-pak. Pt is on day 3 of Z-pak. Pt states symptoms have become worse and now has facial swelling, nausea, and unable to eat.

## 2016-07-24 NOTE — Discharge Instructions (Signed)
Please take the clindamycin 3 times a day for your dental abscess. You may also take the keflex 2 times per day for urinary tract infection. Will culture your urine and results will be called to you in 24-48 hours if positive. You make take the pain medicine as needed. You may also take motrin for pain. Return to the ED if you develop worsening swelling, difficulty with swallowing or breathing or fevers. Please follow up with a dentist this week. I have given you a dental resource guide.

## 2016-07-26 LAB — URINE CULTURE

## 2016-08-30 ENCOUNTER — Emergency Department (HOSPITAL_BASED_OUTPATIENT_CLINIC_OR_DEPARTMENT_OTHER)
Admission: EM | Admit: 2016-08-30 | Discharge: 2016-08-30 | Disposition: A | Discharge status: Home / Self Care | Payer: 59 | Attending: Emergency Medicine | Admitting: Emergency Medicine

## 2016-08-30 ENCOUNTER — Emergency Department (HOSPITAL_BASED_OUTPATIENT_CLINIC_OR_DEPARTMENT_OTHER): Payer: 59

## 2016-08-30 ENCOUNTER — Encounter (HOSPITAL_BASED_OUTPATIENT_CLINIC_OR_DEPARTMENT_OTHER): Payer: Self-pay | Admitting: *Deleted

## 2016-08-30 ENCOUNTER — Other Ambulatory Visit: Payer: Self-pay | Admitting: Emergency Medicine

## 2016-08-30 DIAGNOSIS — Z794 Long term (current) use of insulin: Secondary | ICD-10-CM | POA: Diagnosis not present

## 2016-08-30 DIAGNOSIS — Z79899 Other long term (current) drug therapy: Secondary | ICD-10-CM | POA: Diagnosis not present

## 2016-08-30 DIAGNOSIS — R197 Diarrhea, unspecified: Secondary | ICD-10-CM | POA: Insufficient documentation

## 2016-08-30 DIAGNOSIS — F17200 Nicotine dependence, unspecified, uncomplicated: Secondary | ICD-10-CM | POA: Diagnosis not present

## 2016-08-30 DIAGNOSIS — E119 Type 2 diabetes mellitus without complications: Secondary | ICD-10-CM | POA: Insufficient documentation

## 2016-08-30 DIAGNOSIS — R310 Gross hematuria: Secondary | ICD-10-CM

## 2016-08-30 DIAGNOSIS — N10 Acute tubulo-interstitial nephritis: Secondary | ICD-10-CM | POA: Insufficient documentation

## 2016-08-30 DIAGNOSIS — R319 Hematuria, unspecified: Secondary | ICD-10-CM | POA: Diagnosis present

## 2016-08-30 HISTORY — DX: Systemic lupus erythematosus, unspecified: M32.9

## 2016-08-30 HISTORY — DX: Reserved for concepts with insufficient information to code with codable children: IMO0002

## 2016-08-30 LAB — CBC WITH DIFFERENTIAL/PLATELET
Basophils Absolute: 0 10*3/uL (ref 0.0–0.1)
Basophils Relative: 0 %
Eosinophils Absolute: 0.1 10*3/uL (ref 0.0–0.7)
Eosinophils Relative: 1 %
HCT: 39.6 % (ref 36.0–46.0)
Hemoglobin: 13.2 g/dL (ref 12.0–15.0)
Lymphocytes Relative: 21 %
Lymphs Abs: 2.7 10*3/uL (ref 0.7–4.0)
MCH: 28 pg (ref 26.0–34.0)
MCHC: 33.3 g/dL (ref 30.0–36.0)
MCV: 84.1 fL (ref 78.0–100.0)
Monocytes Absolute: 0.8 10*3/uL (ref 0.1–1.0)
Monocytes Relative: 6 %
Neutro Abs: 9.3 10*3/uL — ABNORMAL HIGH (ref 1.7–7.7)
Neutrophils Relative %: 72 %
Platelets: 314 10*3/uL (ref 150–400)
RBC: 4.71 MIL/uL (ref 3.87–5.11)
RDW: 13.3 % (ref 11.5–15.5)
WBC: 12.9 10*3/uL — ABNORMAL HIGH (ref 4.0–10.5)

## 2016-08-30 LAB — URINALYSIS, MICROSCOPIC (REFLEX)

## 2016-08-30 LAB — BASIC METABOLIC PANEL
Anion gap: 7 (ref 5–15)
BUN: 12 mg/dL (ref 6–20)
CO2: 26 mmol/L (ref 22–32)
Calcium: 9.3 mg/dL (ref 8.9–10.3)
Chloride: 102 mmol/L (ref 101–111)
Creatinine, Ser: 0.47 mg/dL (ref 0.44–1.00)
GFR calc Af Amer: >60 mL/min (ref >60)
GFR calc non Af Amer: >60 mL/min (ref >60)
Glucose, Bld: 246 mg/dL — ABNORMAL HIGH (ref 65–99)
Potassium: 3.6 mmol/L (ref 3.5–5.1)
Sodium: 135 mmol/L (ref 135–145)

## 2016-08-30 LAB — URINALYSIS, ROUTINE W REFLEX MICROSCOPIC

## 2016-08-30 LAB — PREGNANCY, URINE: Preg Test, Ur: NEGATIVE

## 2016-08-30 MED ORDER — SODIUM CHLORIDE 0.9 % IV BOLUS (SEPSIS)
1000.0000 mL | Freq: Once | INTRAVENOUS | Status: AC
  Administered 2016-08-30: 1000 mL via INTRAVENOUS

## 2016-08-30 MED ORDER — ONDANSETRON HCL 4 MG/2ML IJ SOLN
4.0000 mg | Freq: Once | INTRAMUSCULAR | Status: AC
  Administered 2016-08-30: 4 mg via INTRAVENOUS
  Filled 2016-08-30: qty 2

## 2016-08-30 MED ORDER — CEPHALEXIN 500 MG PO CAPS: 500.0000 mg | ORAL_CAPSULE | Freq: Four times a day (QID) | ORAL | 0 refills | Status: DC

## 2016-08-30 MED ORDER — IBUPROFEN 800 MG PO TABS: 800.0000 mg | ORAL_TABLET | Freq: Three times a day (TID) | ORAL | 0 refills | Status: DC

## 2016-08-30 MED ORDER — CEPHALEXIN 250 MG PO CAPS
500.0000 mg | ORAL_CAPSULE | Freq: Once | ORAL | Status: AC
  Administered 2016-08-30: 500 mg via ORAL
  Filled 2016-08-30: qty 2

## 2016-08-30 MED ORDER — HYDROCODONE-ACETAMINOPHEN 5-325 MG PO TABS: 1.0000 | ORAL_TABLET | Freq: Four times a day (QID) | ORAL | 0 refills | Status: DC | PRN

## 2016-08-30 MED ORDER — KETOROLAC TROMETHAMINE 30 MG/ML IJ SOLN
30.0000 mg | Freq: Once | INTRAMUSCULAR | Status: AC
  Administered 2016-08-30: 30 mg via INTRAVENOUS
  Filled 2016-08-30: qty 1

## 2016-08-30 MED ORDER — ONDANSETRON HCL 4 MG PO TABS: 4.0000 mg | ORAL_TABLET | Freq: Four times a day (QID) | ORAL | 0 refills | Status: DC

## 2016-08-30 NOTE — ED Notes (Signed)
Patient reports left flank pain and bladder discomfort that started "the day before yesterday". Patient states she has not taken any medications for the pain. Patient reports her urine had become bloody on her arrival to the ER. Patient denies any vaginal discharge, and reports having a hysterectomy. Patient also c/o nausea. Last PO intake was this am, a cup of cereal.

## 2016-08-30 NOTE — ED Triage Notes (Signed)
Back pain since yesterday. Hematuria today. Urgency. Frequency.

## 2016-08-30 NOTE — ED Notes (Signed)
Patient returned from CT

## 2016-08-30 NOTE — Discharge Instructions (Signed)
Medications: Norco, ibuprofen, Keflex, Zofran  Treatment: Take 1-2 Norco every 4-6 hours as needed for severe pain. Do not drive or operate machinery when taking this medication. Take ibuprofen 3 times daily for mild to moderate pain. Take Keflex as prescribed for 10 days or until your primary care provider tells you otherwise. Make sure to drink plenty of water.  Follow-up: Please follow up with her primary care provider in 2-3 days for further evaluation and treatment. Please follow up with urologist as needed or as directed by PCP. Please return to the emergency department if you develop any new or worsening symptoms.

## 2016-08-30 NOTE — ED Provider Notes (Signed)
[facility] Note   CSN: [id number] Arrival date & time: 08/30/16  1448   By signing my name below, I, Orpah Cobb, attest that this documentation has been prepared under the direction and in the presence of Buel Ream, PA-C. Electronically Signed: Orpah Cobb , ED Scribe. 09/01/16. 1:29 PM.   History   Chief Complaint Chief Complaint  Patient presents with  . Hematuria  . Back Pain    HPI  Jennifer Beltran is a 41 y.o. female with hx of kidney stones, hysterectomy and cholecystectomy who presents to the Emergency Department complaining of intermittent mild to moderate hematuria with sudden onset x1 day. Pt states that she started experiencing dysuria with associated back pain x2 days ago. Pt suspected that she had a UTI but reports the symptoms progressively worsening. Today at work she reportedly had 2 episodes of hematuria. Pt reports hematuria, L sided "pressure" back pain, dysuria, frequency, fever, chills, diaphoresis, nausea and diarrhea. She denies any modifying factors. She denies blood in stool, chest pain, SOB, pelvic pain and vaginal discharge. Of note, pt is getting over a URI and states that she took Benadryl and Robitussin with relief.   The history is provided by the patient. No language interpreter was used.    Past Medical History:  Diagnosis Date  . Diabetes mellitus without complication (HCC)   . Lupus   . Sciatica     There are no active problems to display for this patient.   Past Surgical History:  Procedure Laterality Date  . ABDOMINAL HYSTERECTOMY    . CHOLECYSTECTOMY    . TONSILLECTOMY    . TUBAL LIGATION      OB History    No data available       Home Medications    Prior to Admission medications   Medication Sig Start Date End Date Taking? Authorizing Provider  ATORVASTATIN CALCIUM PO Take by mouth.   Yes Historical Provider, MD  gabapentin (NEURONTIN) 600 MG tablet Take 1,200 mg by mouth 3 (three) times  daily.   Yes Historical Provider, MD  glipiZIDE (GLUCOTROL) 5 MG tablet Take by mouth daily before breakfast.   Yes Historical Provider, MD  insulin glargine (LANTUS) 100 UNIT/ML injection Inject into the skin at bedtime.   Yes Historical Provider, MD  amoxicillin-clavulanate (AUGMENTIN) 875-125 MG tablet Take 1 tablet by mouth every 12 (twelve) hours. 03/17/16   Lona Kettle, PA-C  cephALEXin (KEFLEX) 500 MG capsule Take 1 capsule (500 mg total) by mouth 4 (four) times daily. 08/30/16   Emi Holes, PA-C  clindamycin (CLEOCIN) 150 MG capsule Take 2 capsules (300 mg total) by mouth 3 (three) times daily. 07/24/16   Rise Mu, PA-C  HYDROcodone-acetaminophen (NORCO/VICODIN) 5-325 MG tablet Take 1-2 tablets by mouth every 6 (six) hours as needed. 08/30/16   Emi Holes, PA-C  ibuprofen (ADVIL,MOTRIN) 800 MG tablet Take 1 tablet (800 mg total) by mouth 3 (three) times daily. 08/30/16   Lahari Suttles M Geet Hosking, PA-C  ondansetron (ZOFRAN) 4 MG tablet Take 1 tablet (4 mg total) by mouth every 6 (six) hours. 08/30/16   Emi Holes, PA-C    Family History No family history on file.  Social History Social History  Substance Use Topics  . Smoking status: Current Some Day Smoker  . Smokeless tobacco: Never Used  . Alcohol use No     Allergies   Monistat [tioconazole]; Morphine and related; and Penicillins   Review of Systems Review of Systems  Constitutional: Positive for chills, diaphoresis and fever.  HENT: Negative for facial swelling and sore throat.   Respiratory: Negative for shortness of breath.   Cardiovascular: Negative for chest pain.  Gastrointestinal: Positive for diarrhea and nausea. Negative for abdominal pain, blood in stool and vomiting.  Genitourinary: Positive for dysuria and hematuria. Negative for pelvic pain and vaginal discharge.  Musculoskeletal: Positive for back pain (L sided).  Skin: Negative for rash and wound.  Neurological: Negative for headaches.    Psychiatric/Behavioral: The patient is not nervous/anxious.      Physical Exam Updated Vital Signs BP 99/56 (BP Location: Right Arm)   Pulse 79   Temp 97.6 F (36.4 C) (Oral)   Resp 18   Ht 5' 3.5" (1.613 m)   Wt 104.3 kg   SpO2 95%   BMI 40.10 kg/m   Physical Exam  Constitutional: She appears well-developed and well-nourished. No distress.  HENT:  Head: Normocephalic and atraumatic.  Mouth/Throat: Oropharynx is clear and moist. No oropharyngeal exudate.  Eyes: Conjunctivae are normal. Pupils are equal, round, and reactive to light. Right eye exhibits no discharge. Left eye exhibits no discharge. No scleral icterus.  Neck: Normal range of motion. Neck supple. No thyromegaly present.  Cardiovascular: Normal rate, regular rhythm, normal heart sounds and intact distal pulses.  Exam reveals no gallop and no friction rub.   No murmur heard. Pulmonary/Chest: Effort normal and breath sounds normal. No stridor. No respiratory distress. She has no wheezes. She has no rales.  Abdominal: Soft. Bowel sounds are normal. She exhibits no distension. There is tenderness in the left lower quadrant. There is CVA tenderness. There is no rebound and no guarding.  LLQ and L flank pain as well as L CVA tenderness  Musculoskeletal: She exhibits no edema.  Lymphadenopathy:    She has no cervical adenopathy.  Neurological: She is alert. Coordination normal.  Skin: Skin is warm and dry. No rash noted. She is not diaphoretic. No pallor.  Psychiatric: She has a normal mood and affect.  Nursing note and vitals reviewed.    ED Treatments / Results   DIAGNOSTIC STUDIES: Oxygen Saturation is 100% on RA, normal by my interpretation.   COORDINATION OF CARE: 1:29 PM-Discussed next steps with pt. Pt verbalized understanding and is agreeable with the plan.    Labs (all labs ordered are listed, but only abnormal results are displayed) Labs Reviewed  URINE CULTURE - Abnormal; Notable for the  following:       Result Value   Culture >=100,000 COLONIES/mL KLEBSIELLA PNEUMONIAE (*)    All other components within normal limits  URINALYSIS, ROUTINE W REFLEX MICROSCOPIC - Abnormal; Notable for the following:    Color, Urine BROWN (*)    APPearance TURBID (*)    Glucose, UA   (*)    Value: TEST NOT REPORTED DUE TO COLOR INTERFERENCE OF URINE PIGMENT   Hgb urine dipstick   (*)    Value: TEST NOT REPORTED DUE TO COLOR INTERFERENCE OF URINE PIGMENT   Bilirubin Urine   (*)    Value: TEST NOT REPORTED DUE TO COLOR INTERFERENCE OF URINE PIGMENT   Ketones, ur   (*)    Value: TEST NOT REPORTED DUE TO COLOR INTERFERENCE OF URINE PIGMENT   Protein, ur   (*)    Value: TEST NOT REPORTED DUE TO COLOR INTERFERENCE OF URINE PIGMENT   Nitrite   (*)    Value: TEST NOT REPORTED DUE TO COLOR INTERFERENCE OF URINE PIGMENT  Leukocytes, UA   (*)    Value: TEST NOT REPORTED DUE TO COLOR INTERFERENCE OF URINE PIGMENT   All other components within normal limits  URINALYSIS, MICROSCOPIC (REFLEX) - Abnormal; Notable for the following:    Bacteria, UA MANY (*)    Squamous Epithelial / LPF 0-5 (*)    All other components within normal limits  BASIC METABOLIC PANEL - Abnormal; Notable for the following:    Glucose, Bld 246 (*)    All other components within normal limits  CBC WITH DIFFERENTIAL/PLATELET - Abnormal; Notable for the following:    WBC 12.9 (*)    Neutro Abs 9.3 (*)    All other components within normal limits  PREGNANCY, URINE    EKG  EKG Interpretation None       Radiology Ct Renal Stone Study  Result Date: 08/30/2016 CLINICAL DATA:  41 year old diabetic female with left-sided back pain since yesterday. Hematuria today. Post hysterectomy. Initial encounter. EXAM: CT ABDOMEN AND PELVIS WITHOUT CONTRAST TECHNIQUE: Multidetector CT imaging of the abdomen and pelvis was performed following the standard protocol without IV contrast. COMPARISON:  None. FINDINGS: Lower chest:  Subsegmental atelectasis or scarring inferior right middle lobe. Hepatobiliary: Elongated liver spanning over 22.8 cm. Taking into account limitation by non contrast imaging, no worrisome hepatic lesion. Post cholecystectomy. Pancreas: Taking into account limitation by non contrast imaging, no mass or inflammation. Spleen: Taking into account limitation by non contrast imaging, no mass or enlargement. Adrenals/Urinary Tract: No evidence of renal or ureteral obstructing stone. Calcification within the pelvis has an appearance of phleboliths rather than distal ureteral obstructing stone. No hydronephrosis. Taking into account limitation by non contrast imaging, no renal or adrenal mass. Stomach/Bowel: Evaluation limited by lack of contrast and underdistention. No extra luminal bowel inflammatory process, free fluid or free air. Vascular/Lymphatic: No abdominal aortic aneurysm. No adenopathy. Reproductive: Post hysterectomy. Other: Decompressed urinary bladder with limited evaluation. Musculoskeletal: Mild degenerative changes lumbar spine and right hip. IMPRESSION: No evidence of renal or ureteral obstructing stone. Calcification within the pelvis has an appearance of phlebolith rather than distal ureteral obstructing stone. No hydronephrosis. Elongated liver spanning over 22.8 cm. Subsegmental atelectasis or scarring right lung base. Electronically Signed   By: Lacy Duverney M.D.   On: 08/30/2016 17:03    Procedures Procedures (including critical care time)  Medications Ordered in ED Medications  sodium chloride 0.9 % bolus 1,000 mL (0 mLs Intravenous Stopped 08/30/16 1820)  ondansetron (ZOFRAN) injection 4 mg (4 mg Intravenous Given 08/30/16 1720)  ketorolac (TORADOL) 30 MG/ML injection 30 mg (30 mg Intravenous Given 08/30/16 1720)  cephALEXin (KEFLEX) capsule 500 mg (500 mg Oral Given 08/30/16 1836)     Initial Impression / Assessment and Plan / ED Course  I have reviewed the triage vital signs and the  nursing notes.  Pertinent labs & imaging results that were available during my care of the patient were reviewed by me and considered in my medical decision making (see chart for details).  Clinical Course     Patient with most likely pyelonephritis. Pain control, Zofran, and fluids given in the ED with good control. CBC shows WBC 12.9. BP shows glucose 246. Urine too dark to test, however many bacteria, 6-30 epithelial cells. Urine culture sent. Urine pregnancy negative. CT renal stone study shows [No evidence of renal or ureteral obstructing stone. Calcification within the pelvis has an appearance of phlebolith rather than distal ureteral obstructing stone. No hydronephrosis. Elongated liver spanning over 22.8 cm. Subsegmental atelectasis  or scarring right lung base.] Discharge home with Keflex, Norco, ibuprofen, Zofran. [place] narcotic database reviewed and found no discrepancies. Return precautions discussed. Patient understands and agrees with plan. Patient vitals stable throughout ED course and discharged in satisfactory condition.  Final Clinical Impressions(s) / ED Diagnoses   Final diagnoses:  Gross hematuria  Acute pyelonephritis    New Prescriptions Discharge Medication List as of 08/30/2016  6:47 PM    START taking these medications   Details  ondansetron (ZOFRAN) 4 MG tablet Take 1 tablet (4 mg total) by mouth every 6 (six) hours., Starting Mon 08/30/2016, Print       I personally performed the services described in this documentation, which was scribed in my presence. The recorded information has been reviewed and is accurate.     Emi Holeslexandra M Yamilka Lopiccolo, PA-C 09/01/16 1333    Nira ConnPedro Eduardo Cardama, MD 09/03/16 [phone]

## 2016-08-30 NOTE — ED Notes (Signed)
ED Provider at bedside. 

## 2016-09-02 LAB — URINE CULTURE
Culture: >=100000 — AB
Special Requests: NORMAL

## 2016-09-03 ENCOUNTER — Telehealth (HOSPITAL_BASED_OUTPATIENT_CLINIC_OR_DEPARTMENT_OTHER): Payer: Self-pay

## 2016-09-03 NOTE — Telephone Encounter (Signed)
Post ED Visit - Positive Culture Follow-up  Culture report reviewed by antimicrobial stewardship pharmacist:  []  Enzo BiNathan Batchelder, Pharm.D. []  Celedonio MiyamotoJeremy Frens, Pharm.D., BCPS []  Garvin FilaMike Maccia, Pharm.D. []  Georgina PillionElizabeth Martin, Pharm.D., BCPS []  ThiellsMinh Pham, [address]Ph[address]D., BCPS, AAHIVP []  Estella HuskMichelle Turner, Pharm.D., BCPS, AAHIVP []  Tennis Mustassie Penning, Pharm.D. []  Sherle Poeob Vincent, [address]Ph[address]Estella Husk. X  Joseph Arminger, Pharm.D.  Positive urine culture, >/= 100,000 colonies -> Klebsiella Pneumoniae Treated with Cephalexin, organism sensitive to the same and no further patient follow-up is required at this time.  Arvid RightClark, Jenasia Dolinar Dorn 09/03/2016, 10:00 AM

## 2018-01-09 ENCOUNTER — Other Ambulatory Visit: Payer: Self-pay

## 2018-01-09 ENCOUNTER — Emergency Department (HOSPITAL_BASED_OUTPATIENT_CLINIC_OR_DEPARTMENT_OTHER)
Admission: EM | Admit: 2018-01-09 | Discharge: 2018-01-09 | Disposition: A | Discharge status: Home / Self Care | Payer: 59 | Attending: Emergency Medicine | Admitting: Emergency Medicine

## 2018-01-09 ENCOUNTER — Emergency Department (HOSPITAL_BASED_OUTPATIENT_CLINIC_OR_DEPARTMENT_OTHER): Payer: 59

## 2018-01-09 ENCOUNTER — Encounter (HOSPITAL_BASED_OUTPATIENT_CLINIC_OR_DEPARTMENT_OTHER): Payer: Self-pay | Admitting: *Deleted

## 2018-01-09 DIAGNOSIS — Z794 Long term (current) use of insulin: Secondary | ICD-10-CM | POA: Insufficient documentation

## 2018-01-09 DIAGNOSIS — Z79899 Other long term (current) drug therapy: Secondary | ICD-10-CM | POA: Insufficient documentation

## 2018-01-09 DIAGNOSIS — F17210 Nicotine dependence, cigarettes, uncomplicated: Secondary | ICD-10-CM | POA: Diagnosis not present

## 2018-01-09 DIAGNOSIS — E119 Type 2 diabetes mellitus without complications: Secondary | ICD-10-CM | POA: Diagnosis not present

## 2018-01-09 DIAGNOSIS — R112 Nausea with vomiting, unspecified: Secondary | ICD-10-CM

## 2018-01-09 DIAGNOSIS — R1084 Generalized abdominal pain: Secondary | ICD-10-CM | POA: Insufficient documentation

## 2018-01-09 DIAGNOSIS — R197 Diarrhea, unspecified: Secondary | ICD-10-CM

## 2018-01-09 HISTORY — DX: Fibromyalgia: M79.7

## 2018-01-09 LAB — URINALYSIS, ROUTINE W REFLEX MICROSCOPIC
Bilirubin Urine: NEGATIVE
Glucose, UA: NEGATIVE mg/dL
HGB URINE DIPSTICK: NEGATIVE
Ketones, ur: 15 mg/dL — AB
LEUKOCYTES UA: NEGATIVE
Nitrite: NEGATIVE
PH: 5.5 (ref 5.0–8.0)
Protein, ur: NEGATIVE mg/dL
Specific Gravity, Urine: >1.03 — ABNORMAL HIGH (ref 1.005–1.030)

## 2018-01-09 LAB — CBC WITH DIFFERENTIAL/PLATELET
BASOS ABS: 0 10*3/uL (ref 0.0–0.1)
BASOS PCT: 0 %
EOS ABS: 0.9 10*3/uL — AB (ref 0.0–0.7)
EOS PCT: 8 %
HCT: 38.8 % (ref 36.0–46.0)
Hemoglobin: 13.4 g/dL (ref 12.0–15.0)
Lymphocytes Relative: 22 %
Lymphs Abs: 2.5 10*3/uL (ref 0.7–4.0)
MCH: 29.5 pg (ref 26.0–34.0)
MCHC: 34.5 g/dL (ref 30.0–36.0)
MCV: 85.3 fL (ref 78.0–100.0)
MONO ABS: 0.6 10*3/uL (ref 0.1–1.0)
Monocytes Relative: 5 %
Neutro Abs: 7.3 10*3/uL (ref 1.7–7.7)
Neutrophils Relative %: 65 %
PLATELETS: 319 10*3/uL (ref 150–400)
RBC: 4.55 MIL/uL (ref 3.87–5.11)
RDW: 13.5 % (ref 11.5–15.5)
WBC: 11.3 10*3/uL — ABNORMAL HIGH (ref 4.0–10.5)

## 2018-01-09 LAB — COMPREHENSIVE METABOLIC PANEL
ALT: 20 U/L (ref 14–54)
AST: 18 U/L (ref 15–41)
Albumin: 3.4 g/dL — ABNORMAL LOW (ref 3.5–5.0)
Alkaline Phosphatase: 70 U/L (ref 38–126)
Anion gap: 10 (ref 5–15)
BILIRUBIN TOTAL: 0.3 mg/dL (ref 0.3–1.2)
BUN: 11 mg/dL (ref 6–20)
CALCIUM: 9.2 mg/dL (ref 8.9–10.3)
CO2: 23 mmol/L (ref 22–32)
CREATININE: 0.42 mg/dL — AB (ref 0.44–1.00)
Chloride: 102 mmol/L (ref 101–111)
GFR calc non Af Amer: >60 mL/min (ref >60)
Glucose, Bld: 236 mg/dL — ABNORMAL HIGH (ref 65–99)
Potassium: 3.8 mmol/L (ref 3.5–5.1)
Sodium: 135 mmol/L (ref 135–145)
TOTAL PROTEIN: 6.8 g/dL (ref 6.5–8.1)

## 2018-01-09 LAB — LIPASE, BLOOD: Lipase: 22 U/L (ref 11–51)

## 2018-01-09 MED ORDER — OXYCODONE-ACETAMINOPHEN 5-325 MG PO TABS
1.0000 | ORAL_TABLET | Freq: Once | ORAL | Status: AC
  Administered 2018-01-09: 1 via ORAL
  Filled 2018-01-09: qty 1

## 2018-01-09 MED ORDER — FAMOTIDINE IN NACL 20-0.9 MG/50ML-% IV SOLN
20.0000 mg | Freq: Once | INTRAVENOUS | Status: AC
  Administered 2018-01-09: 20 mg via INTRAVENOUS
  Filled 2018-01-09: qty 50

## 2018-01-09 MED ORDER — IOPAMIDOL (ISOVUE-300) INJECTION 61%: 15.0000 mL | Freq: Once | INTRAVENOUS | Status: DC | PRN

## 2018-01-09 MED ORDER — SODIUM CHLORIDE 0.9 % IV BOLUS: 1000.0000 mL | Freq: Once | INTRAVENOUS | Status: DC

## 2018-01-09 MED ORDER — SODIUM CHLORIDE 0.9 % IV BOLUS
1000.0000 mL | Freq: Once | INTRAVENOUS | Status: AC
  Administered 2018-01-09: 1000 mL via INTRAVENOUS

## 2018-01-09 MED ORDER — PROMETHAZINE HCL 25 MG/ML IJ SOLN
25.0000 mg | Freq: Once | INTRAMUSCULAR | Status: AC
  Administered 2018-01-09: 25 mg via INTRAVENOUS
  Filled 2018-01-09: qty 1

## 2018-01-09 MED ORDER — IOPAMIDOL (ISOVUE-300) INJECTION 61%
100.0000 mL | Freq: Once | INTRAVENOUS | Status: AC | PRN
  Administered 2018-01-09: 100 mL via INTRAVENOUS

## 2018-01-09 MED ORDER — MORPHINE SULFATE (PF) 4 MG/ML IV SOLN
4.0000 mg | Freq: Once | INTRAVENOUS | Status: DC
  Filled 2018-01-09: qty 1

## 2018-01-09 NOTE — ED Notes (Signed)
Pt asleep.  pts son at bedside. When pt awake, I asked that she continue to drink fluids.

## 2018-01-09 NOTE — ED Provider Notes (Signed)
[facility] EMERGENCY DEPARTMENT Provider Note   CSN: [id number] Arrival date & time: 01/09/18  0840     History   Chief Complaint Chief Complaint  Patient presents with  . Abdominal Pain    HPI Jennifer Beltran is a 42 y.o. female.  Patient is a 42 year old female with a history of diabetes, fibromyalgia, lupus and sciatica who presents with diarrhea and abdominal pain.  She states she has had a 3-week history of diarrhea, vomiting and abdominal pain.  She initially was seen in the ED at [facility].  She had a CT scan and stool studies which were unremarkable.  She states the symptoms have been ongoing.  She has watery diarrhea which even happens while she is sleeping without her knowledge.  She has had what she calls a sour stomach and feels like she cannot eat or drink anything.  She is been using Zofran and Phenergan at home without improvement in symptoms.  She has had worsening pain in her abdomen.  She states she has had some intermittent fevers but cannot quantify it.  She denies any travel outside the country.  She now has some urinary symptoms including burning on urination and some lower back pain.  She was taking antibiotics about 2 weeks before the symptoms started.  She has followed up with her PCP in the interim who prescribed which she states is an anti-diarrhea/antibiotic medication but it has not been approved by her insurance yet.     Past Medical History:  Diagnosis Date  . Diabetes mellitus without complication (HCC)   . Fibromyalgia   . Lupus (HCC)   . Sciatica     There are no active problems to display for this patient.   Past Surgical History:  Procedure Laterality Date  . ABDOMINAL HYSTERECTOMY    . CHOLECYSTECTOMY    . TONSILLECTOMY    . TUBAL LIGATION       OB History   None      Home Medications    Prior to Admission medications   Medication Sig Start Date End Date Taking? Authorizing Provider  Empagliflozin (JARDIANCE PO)  Take by mouth.   Yes [provider]  lisinopril (PRINIVIL,ZESTRIL) 10 MG tablet Take 10 mg by mouth daily.   Yes [provider]  oxycodone (OXY-IR) 5 MG capsule Take 10 mg by mouth every 4 (four) hours as needed.   Yes [provider]  ATORVASTATIN CALCIUM PO Take by mouth.    [provider]  gabapentin (NEURONTIN) 600 MG tablet Take 1,200 mg by mouth 3 (three) times daily.    [provider]  glipiZIDE (GLUCOTROL) 5 MG tablet Take by mouth daily before breakfast.    [provider]  insulin glargine (LANTUS) 100 UNIT/ML injection Inject into the skin at bedtime.    [provider]  ondansetron (ZOFRAN) 4 MG tablet Take 1 tablet (4 mg total) by mouth every 6 (six) hours. 08/30/16   Emi Holes, PA-C    Family History History reviewed. No pertinent family history.  Social History Social History   Tobacco Use  . Smoking status: Current Some Day Smoker  . Smokeless tobacco: Never Used  Substance Use Topics  . Alcohol use: No  . Drug use: No     Allergies   Monistat [tioconazole] and Penicillins   Review of Systems Review of Systems  Constitutional: Positive for fatigue. Negative for chills, diaphoresis and fever.  HENT: Negative for congestion, rhinorrhea and sneezing.   Eyes:  Negative.   Respiratory: Negative for cough, chest tightness and shortness of breath.   Cardiovascular: Negative for chest pain and leg swelling.  Gastrointestinal: Positive for abdominal pain, diarrhea, nausea and vomiting. Negative for blood in stool.  Genitourinary: Positive for dysuria. Negative for difficulty urinating, flank pain, frequency and hematuria.  Musculoskeletal: Positive for back pain. Negative for arthralgias.  Skin: Negative for rash.  Neurological: Negative for dizziness, speech difficulty, weakness, numbness and headaches.     Physical Exam Updated Vital Signs BP 106/64 (BP Location: Right Arm)   Pulse 68    Temp 98.6 F (37 C) (Oral)   Resp 18   Ht  (1.626 m)   Wt 97.3 kg (214 lb 8.1 oz)   SpO2 98%   BMI 36.82 kg/m   Physical Exam  Constitutional: She is oriented to person, place, and time. She appears well-developed and well-nourished.  HENT:  Head: Normocephalic and atraumatic.  Eyes: Pupils are equal, round, and reactive to light.  Neck: Normal range of motion. Neck supple.  Cardiovascular: Normal rate, regular rhythm and normal heart sounds.  Pulmonary/Chest: Effort normal and breath sounds normal. No respiratory distress. She has no wheezes. She has no rales. She exhibits no tenderness.  Abdominal: Soft. Bowel sounds are normal. There is generalized tenderness. There is no rebound and no guarding.  Musculoskeletal: Normal range of motion. She exhibits no edema.  Lymphadenopathy:    She has no cervical adenopathy.  Neurological: She is alert and oriented to person, place, and time.  Skin: Skin is warm and dry. No rash noted.  Psychiatric: She has a normal mood and affect.     ED Treatments / Results  Labs (all labs ordered are listed, but only abnormal results are displayed) Labs Reviewed  URINALYSIS, ROUTINE W REFLEX MICROSCOPIC - Abnormal; Notable for the following components:      Result Value   Specific Gravity, Urine >1.030 (*)    Ketones, ur 15 (*)    All other components within normal limits  COMPREHENSIVE METABOLIC PANEL - Abnormal; Notable for the following components:   Glucose, Bld 236 (*)    Creatinine, Ser 0.42 (*)    Albumin 3.4 (*)    All other components within normal limits  CBC WITH DIFFERENTIAL/PLATELET - Abnormal; Notable for the following components:   WBC 11.3 (*)    Eosinophils Absolute 0.9 (*)    All other components within normal limits  LIPASE, BLOOD    EKG None  Radiology Ct Abdomen Pelvis W Contrast  Result Date: 01/09/2018 CLINICAL DATA:  Generalized abdominal pain with nausea, vomiting and diarrhea for 3 weeks. EXAM: CT ABDOMEN  AND PELVIS WITH CONTRAST TECHNIQUE: Multidetector CT imaging of the abdomen and pelvis was performed using the standard protocol following bolus administration of intravenous contrast. CONTRAST:  ISOVUE-300 IOPAMIDOL (ISOVUE-300) INJECTION 61% COMPARISON:  08/30/2016 FINDINGS: Lower chest: No acute abnormality. Hepatobiliary: No focal liver abnormality is seen. Status post cholecystectomy. No biliary dilatation. Pancreas: Unremarkable. No pancreatic ductal dilatation or surrounding inflammatory changes. Spleen: Normal in size without focal abnormality. Adrenals/Urinary Tract: 12 mm right adrenal adenoma, stable. Normal left adrenal gland. Normal kidneys, ureters and bladder. Stomach/Bowel: Stomach and small bowel unremarkable. Mild to moderate increased stool noted throughout the colon thickening or inflammation. Normal appendix visualized.  No colonic wall Vascular/Lymphatic: No significant vascular findings are present. No enlarged abdominal or pelvic lymph nodes. Reproductive: Status post hysterectomy. No adnexal masses. Other: No abdominal wall hernia or abnormality. No abdominopelvic ascites. Musculoskeletal:  No acute or significant osseous findings. IMPRESSION: 1. No acute findings within the abdomen or pelvis. 2. Mild to moderate increased stool throughout the colon. No bowel inflammation. 3. Status post cholecystectomy and hysterectomy. Electronically Signed   By: Amie Portland M.D.   On: 01/09/2018 10:56    Procedures Procedures (including critical care time)  Medications Ordered in ED Medications  iopamidol (ISOVUE-300) 61 % injection 15 mL (has no administration in time range)  sodium chloride 0.9 % bolus 1,000 mL (has no administration in time range)  morphine 4 MG/ML injection 4 mg (4 mg Intravenous Not Given 01/09/18 1230)  sodium chloride 0.9 % bolus 1,000 mL (0 mLs Intravenous Stopped 01/09/18 1245)  famotidine (PEPCID) IVPB 20 mg premix (0 mg Intravenous Stopped 01/09/18 1046)    iopamidol (ISOVUE-300) 61 % injection 100 mL (100 mLs Intravenous Contrast Given 01/09/18 1029)  promethazine (PHENERGAN) injection 25 mg (25 mg Intravenous Given 01/09/18 1209)  oxyCODONE-acetaminophen (PERCOCET/ROXICET) 5-325 MG per tablet 1 tablet (1 tablet Oral Given 01/09/18 1249)     Initial Impression / Assessment and Plan / ED Course  I have reviewed the triage vital signs and the nursing notes.  Pertinent labs & imaging results that were available during my care of the patient were reviewed by me and considered in my medical decision making (see chart for details).     Patient is a 42 year old female who presents with a 3-week history of nausea vomiting diarrhea with abdominal pain.  Given that her symptoms are ongoing, I did do a repeat CT scan which showed no acute abnormalities.  No evidence of colitis.  I reviewed her records from [place] and she had a full GI panel which was negative.  C. difficile is negative.  I attempted to repeat the C. difficile today but she was unable to produce a stool sample.  Her abdominal exam is benign.  Her symptoms have improved with treatment in the ED.  Her labs are non-concerning.  She has medications to use at home for her symptoms.  She was discharged home in good condition.  She was given a referral to follow-up with gastroenterology if her symptoms continue.  In the short-term, she was encouraged to follow-up with her PCP. Final Clinical Impressions(s) / ED Diagnoses   Final diagnoses:  Generalized abdominal pain  Nausea vomiting and diarrhea    ED Discharge Orders    None       Rolan Bucco, MD 01/09/18 1452

## 2018-01-09 NOTE — ED Triage Notes (Signed)
Pt reports generalized abdominal pain. States intermittent N/V/D. States incontinent of diarrhea x 3 weeks. Pt has seen her PCP for same-has been given phenergan and zofran. Has also been to [facility] for same.

## 2018-01-09 NOTE — ED Notes (Signed)
Pt drinking ginger ale for PO Challenge

## 2018-01-09 NOTE — ED Notes (Signed)
Pt ambulatory to urinate. No distress.

## 2018-04-02 IMAGING — CT CT HEAD W/O CM
3 series · 14 of 47 positions shown, 16 images · non-contrast
Comparison: None.

CLINICAL DATA: Near syncopal episode today. Dizziness. Increased
heart rate. Headache.

EXAM:
CT HEAD WITHOUT CONTRAST
TECHNIQUE: Contiguous axial images were obtained from the base of the skull
through the vertex without intravenous contrast.

[Series 2: head wo · axial · 0.46mm/px · z∈[-144,-19]mm · 8 of 31 slices shown, 10 images]
[im 3/31  brain]
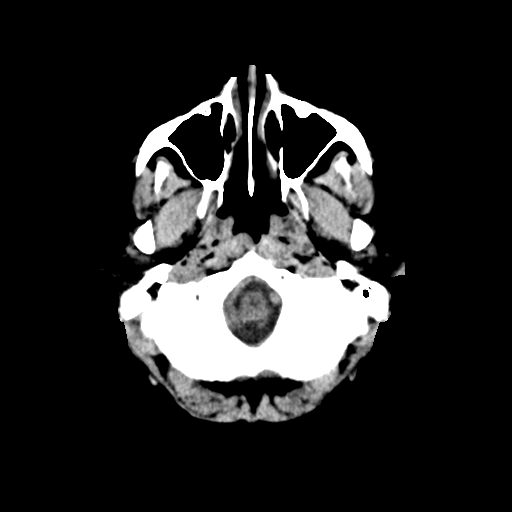
[im 3/31  bone]
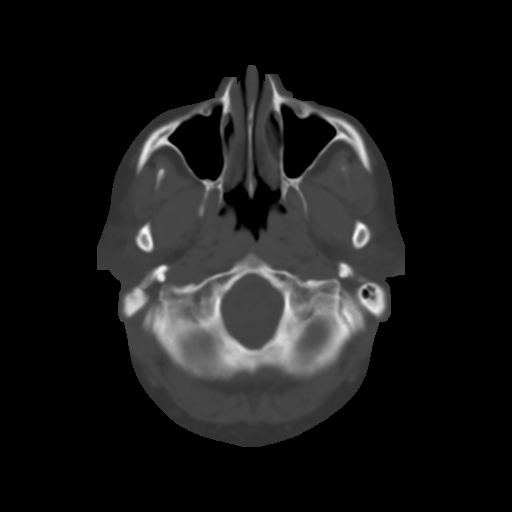
[im 7/31  brain]
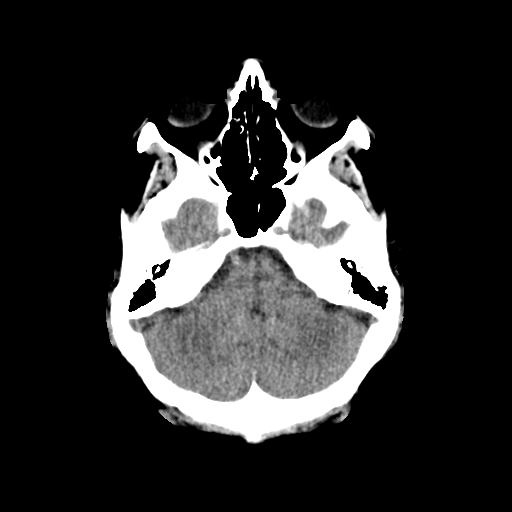
[im 10/31  brain]
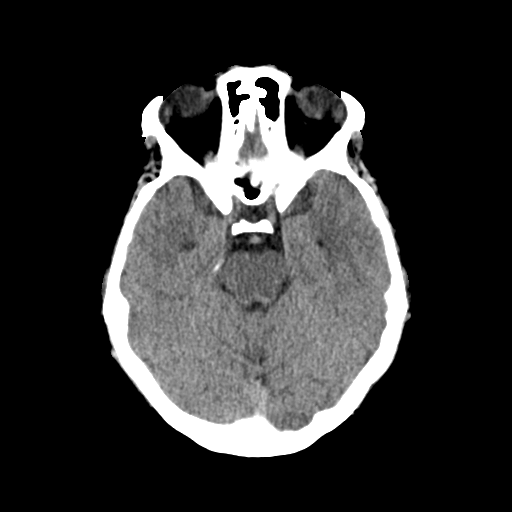
[im 14/31  brain]
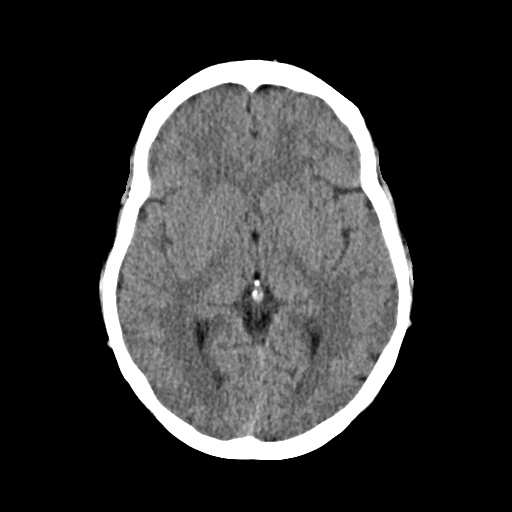
[im 17/31  brain]
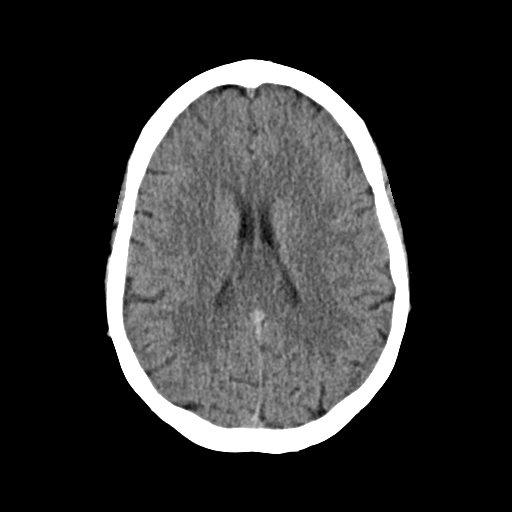
[im 17/31  bone]
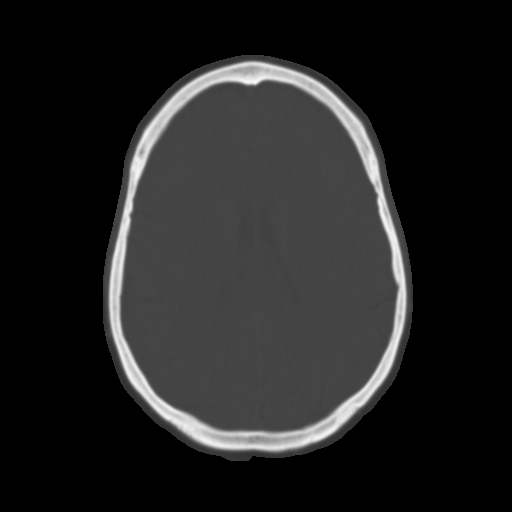
[im 21/31  brain]
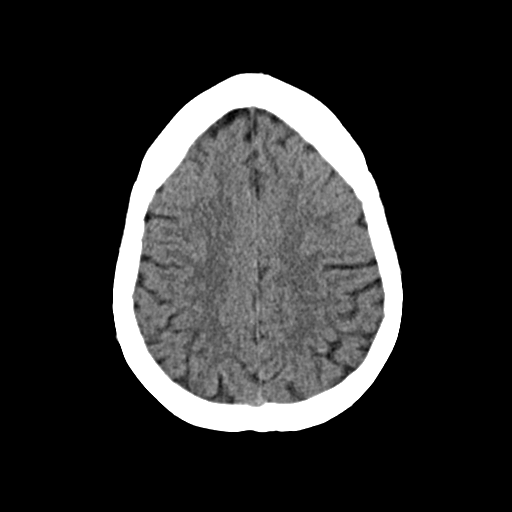
[im 24/31  brain]
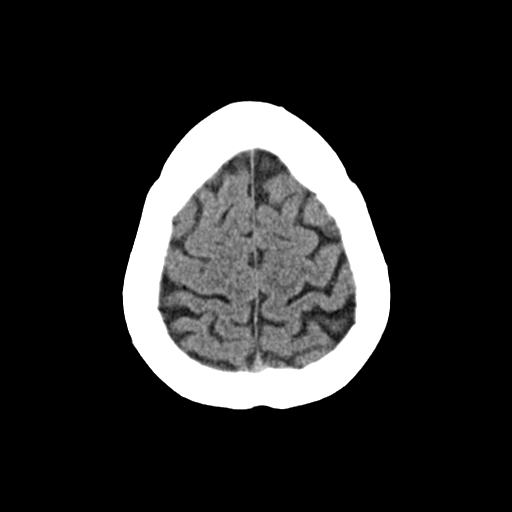
[im 28/31  brain]
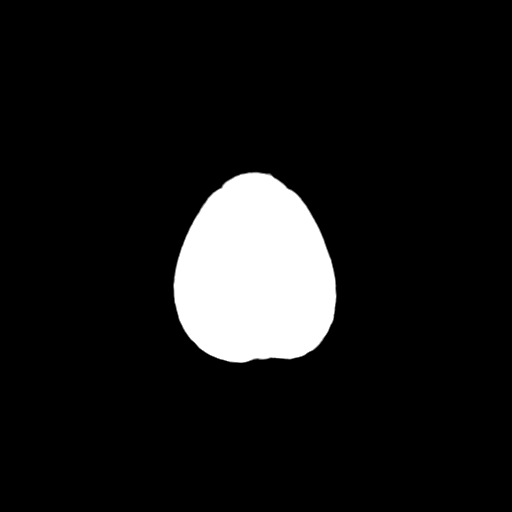

[Series 4: coronal soft · coronal · 0.36mm/px · 3 of 63 slices shown]
[im 21/63  brain]
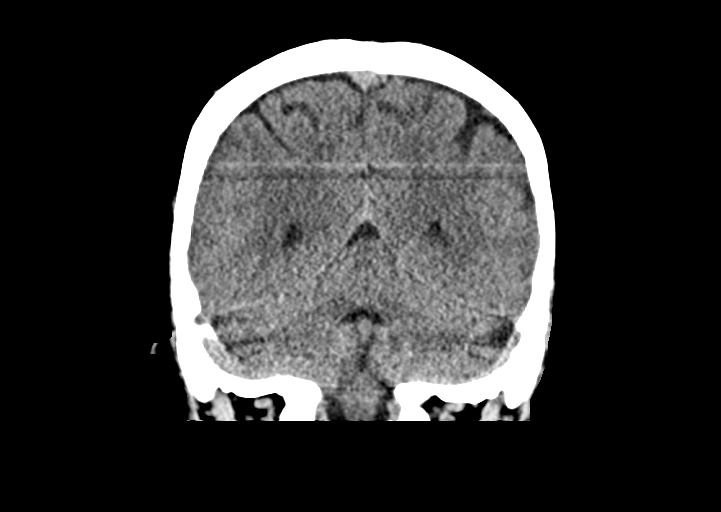
[im 28/63  brain]
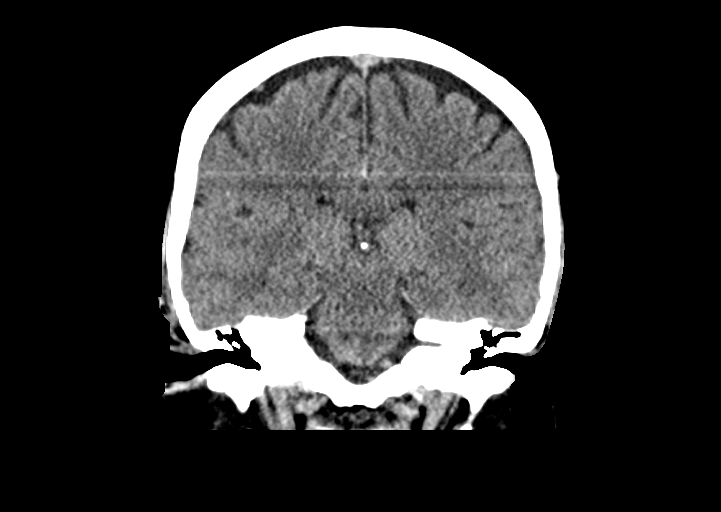
[im 35/63  brain]
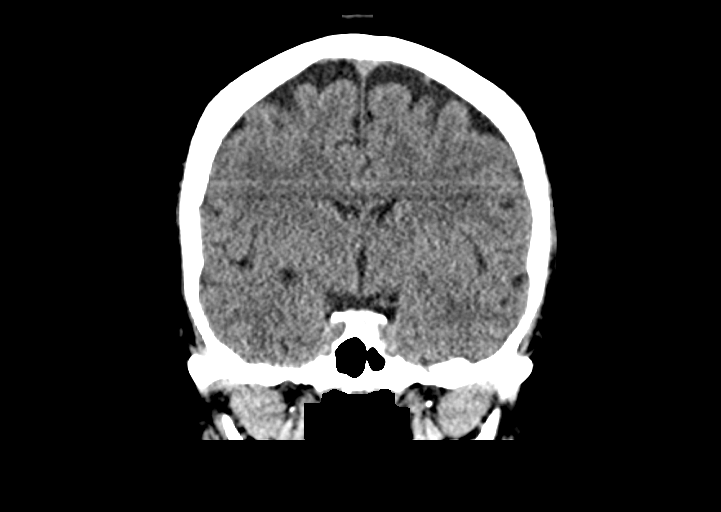

[Series 5: sag soft · sagittal · 0.32mm/px · 3 of 55 slices shown]
[im 19/55  brain]
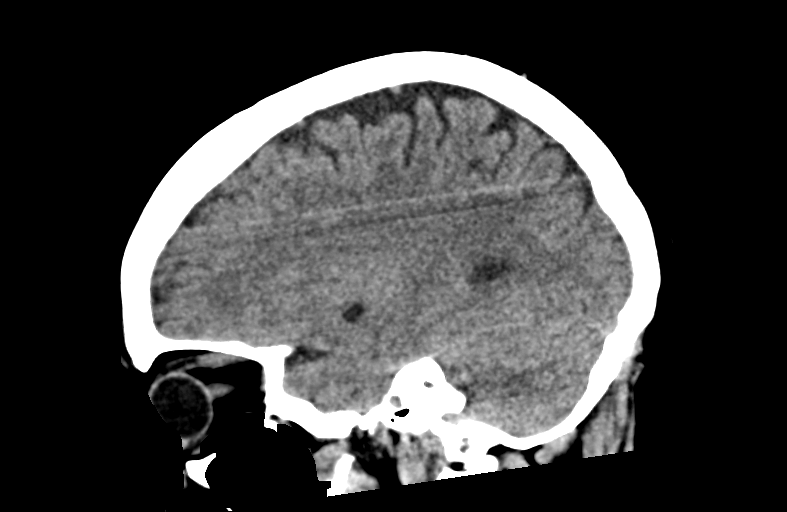
[im 28/55  brain]
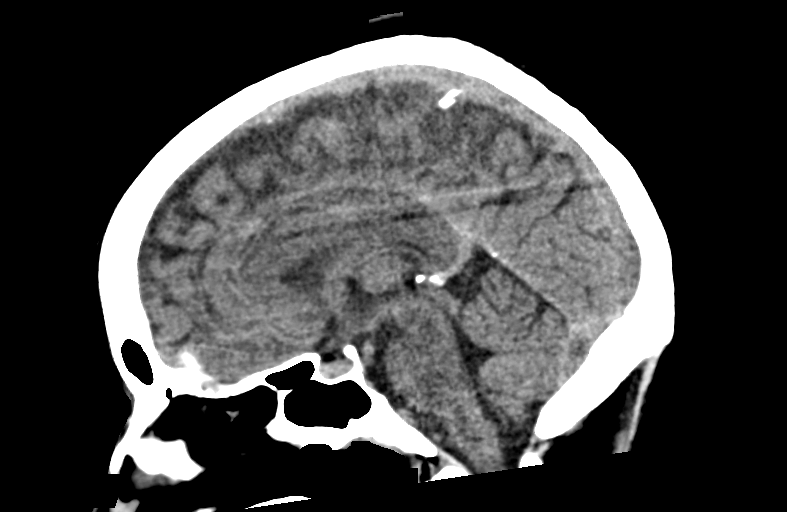
[im 37/55  brain]
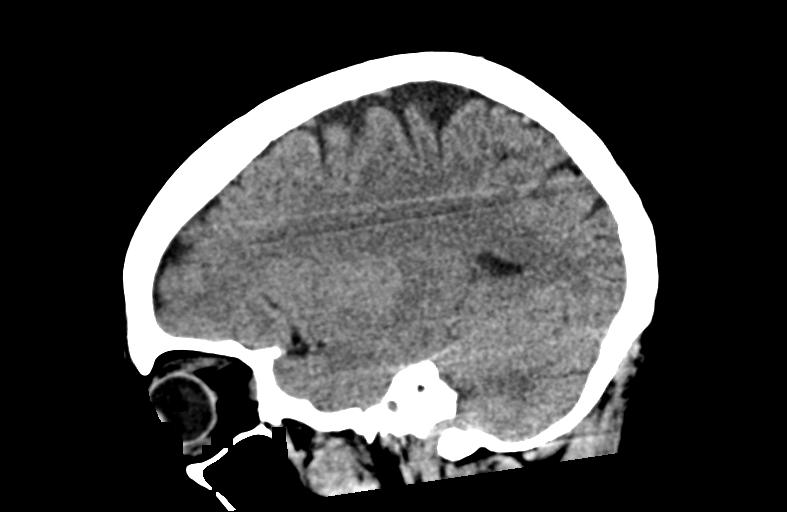

[14 of 47 positions shown; findings below may reference images not displayed]

FINDINGS: No acute cortical infarct, hemorrhage, or mass lesion is present.
The ventricles are of normal size. No significant extra-axial fluid
collection is evident. The paranasal sinuses and mastoid air cells
are clear. The calvarium is intact. The globes and orbits are within
normal limits. No significant extracranial soft tissue lesions are
present.
IMPRESSION: Negative CT of the head.

## 2018-06-05 ENCOUNTER — Encounter (HOSPITAL_BASED_OUTPATIENT_CLINIC_OR_DEPARTMENT_OTHER): Payer: Self-pay | Admitting: *Deleted

## 2018-06-05 ENCOUNTER — Other Ambulatory Visit: Payer: Self-pay

## 2018-06-05 ENCOUNTER — Emergency Department (HOSPITAL_BASED_OUTPATIENT_CLINIC_OR_DEPARTMENT_OTHER)
Admission: EM | Admit: 2018-06-05 | Discharge: 2018-06-05 | Disposition: A | Discharge status: Home / Self Care | Payer: 59 | Attending: Emergency Medicine | Admitting: Emergency Medicine

## 2018-06-05 DIAGNOSIS — Z79899 Other long term (current) drug therapy: Secondary | ICD-10-CM | POA: Diagnosis not present

## 2018-06-05 DIAGNOSIS — Z794 Long term (current) use of insulin: Secondary | ICD-10-CM | POA: Diagnosis not present

## 2018-06-05 DIAGNOSIS — E119 Type 2 diabetes mellitus without complications: Secondary | ICD-10-CM | POA: Insufficient documentation

## 2018-06-05 DIAGNOSIS — F17210 Nicotine dependence, cigarettes, uncomplicated: Secondary | ICD-10-CM | POA: Diagnosis not present

## 2018-06-05 DIAGNOSIS — N61 Mastitis without abscess: Secondary | ICD-10-CM | POA: Diagnosis not present

## 2018-06-05 DIAGNOSIS — N644 Mastodynia: Secondary | ICD-10-CM | POA: Diagnosis present

## 2018-06-05 LAB — CBG MONITORING, ED: GLUCOSE-CAPILLARY: 284 mg/dL — AB (ref 70–99)

## 2018-06-05 MED ORDER — FLUCONAZOLE 200 MG PO TABS: ORAL_TABLET | ORAL | 0 refills | Status: DC

## 2018-06-05 MED ORDER — CEPHALEXIN 500 MG PO CAPS: 500.0000 mg | ORAL_CAPSULE | Freq: Four times a day (QID) | ORAL | 0 refills | Status: AC

## 2018-06-05 NOTE — ED Provider Notes (Signed)
[facility] EMERGENCY DEPARTMENT Provider Note   CSN: [id number] Arrival date & time: 06/05/18  2059     History   Chief Complaint Chief Complaint  Patient presents with  . Rash    HPI Jennifer Beltran is a 42 y.o. female.  HPI   Presents with right breast pain and rash Began yesterday, itching, burning, painful Redness to left breast Started out as small spot Had some clear drainage from breast Marked it and it got worse, increased redness Took benadryl and antihistamine Chills but no fever Nausea, no vomiting DM   Past Medical History:  Diagnosis Date  . Diabetes mellitus without complication (HCC)   . Fibromyalgia   . Lupus (HCC)   . Sciatica     There are no active problems to display for this patient.   Past Surgical History:  Procedure Laterality Date  . ABDOMINAL HYSTERECTOMY    . CHOLECYSTECTOMY    . TONSILLECTOMY    . TUBAL LIGATION       OB History   None      Home Medications    Prior to Admission medications   Medication Sig Start Date End Date Taking? Authorizing Provider  ATORVASTATIN CALCIUM PO Take by mouth.   Yes [provider]  Empagliflozin (JARDIANCE PO) Take by mouth.   Yes [provider]  gabapentin (NEURONTIN) 600 MG tablet Take 1,200 mg by mouth 3 (three) times daily.   Yes [provider]  glipiZIDE (GLUCOTROL) 5 MG tablet Take by mouth daily before breakfast.   Yes [provider]  insulin glargine (LANTUS) 100 UNIT/ML injection Inject into the skin at bedtime.   Yes [provider]  lisinopril (PRINIVIL,ZESTRIL) 10 MG tablet Take 10 mg by mouth daily.   Yes [provider]  oxycodone (OXY-IR) 5 MG capsule Take 10 mg by mouth every 4 (four) hours as needed.   Yes [provider]  cephALEXin (KEFLEX) 500 MG capsule Take 1 capsule (500 mg total) by mouth 4 (four) times daily for 10 days. 06/05/18 06/15/18  Alvira Monday, MD  fluconazole (DIFLUCAN)  200 MG tablet Take 1 tablet for yeast infection, repeat in 72 hours if continuing symptoms. 06/05/18   Alvira Monday, MD  ondansetron (ZOFRAN) 4 MG tablet Take 1 tablet (4 mg total) by mouth every 6 (six) hours. 08/30/16   Emi Holes, PA-C    Family History No family history on file.  Social History Social History   Tobacco Use  . Smoking status: Current Some Day Smoker  . Smokeless tobacco: Never Used  Substance Use Topics  . Alcohol use: No  . Drug use: No     Allergies   Monistat [tioconazole] and Penicillins   Review of Systems Review of Systems  Constitutional: Positive for chills. Negative for fever.  HENT: Negative for sore throat.   Eyes: Negative for visual disturbance.  Respiratory: Negative for cough and shortness of breath.   Cardiovascular: Negative for chest pain.  Gastrointestinal: Positive for nausea. Negative for abdominal pain.  Genitourinary: Negative for difficulty urinating.  Musculoskeletal: Negative for back pain and neck pain.  Skin: Positive for rash.  Neurological: Negative for syncope and headaches.     Physical Exam Updated Vital Signs BP 94/62 (BP Location: Left Arm)   Pulse 72   Temp 98.5 F (36.9 C) (Oral)   Resp 18   Ht 5' 4.5" (1.638 m)   Wt 94.3 kg   SpO2 94%   BMI 35.15 kg/m  Physical Exam  Constitutional: She is oriented to person, place, and time. She appears well-developed and well-nourished. No distress.  HENT:  Head: Normocephalic and atraumatic.  Eyes: Conjunctivae and EOM are normal.  Neck: Normal range of motion.  Cardiovascular: Normal rate, regular rhythm, normal heart sounds and intact distal pulses. Exam reveals no gallop and no friction rub.  No murmur heard. Pulmonary/Chest: Effort normal and breath sounds normal. No respiratory distress. She has no wheezes. She has no rales.  Left breast with erythema No skin dimpling, no abscess palpated  Musculoskeletal: She exhibits no edema or tenderness.    Neurological: She is alert and oriented to person, place, and time.  Skin: Skin is warm and dry. No rash noted. She is not diaphoretic. There is erythema (left breast).  Nursing note and vitals reviewed.    ED Treatments / Results  Labs (all labs ordered are listed, but only abnormal results are displayed) Labs Reviewed  CBG MONITORING, ED - Abnormal; Notable for the following components:      Result Value   Glucose-Capillary 284 (*)    All other components within normal limits    EKG None  Radiology No results found.  Procedures Procedures (including critical care time)  Medications Ordered in ED Medications - No data to display   Initial Impression / Assessment and Plan / ED Course  I have reviewed the triage vital signs and the nursing notes.  Pertinent labs & imaging results that were available during my care of the patient were reviewed by me and considered in my medical decision making (see chart for details).     42yo female with history above including history of DM presents with concern for redness of the left breast. Mild hyperglycemia, no vomiting, at this time do not feel further labs indicated, however discussed if she has worsening symptoms I recommend return to the ED.  Pt without fever or tachycardia, has mild erythema to left breast without signs of abscess. Appearance most consistent with cellulitis and will treat with keflex, although would consider allergic/contact dermatitis in differential given pruritic component, and with both appearance and nipple discharge also consider inflammatory breast cancer.  GIven abx, recommend close PCP and breast center follow up. Patient discharged in stable condition with understanding of reasons to return.   Final Clinical Impressions(s) / ED Diagnoses   Final diagnoses:  Cellulitis of left breast    ED Discharge Orders         Ordered    cephALEXin (KEFLEX) 500 MG capsule  4 times daily     06/05/18 2332     fluconazole (DIFLUCAN) 200 MG tablet     06/05/18 2342           Alvira Monday, MD 06/06/18 1315

## 2018-06-05 NOTE — ED Triage Notes (Signed)
Redness and pain to her left breast since yesterday. Slight clear drainage from her nipple. She has been taking Benadryl since yesterday.

## 2020-01-16 ENCOUNTER — Emergency Department (HOSPITAL_BASED_OUTPATIENT_CLINIC_OR_DEPARTMENT_OTHER)
Admission: EM | Admit: 2020-01-16 | Discharge: 2020-01-16 | Disposition: A | Discharge status: Home / Self Care | Payer: BC Managed Care – PPO | Attending: Emergency Medicine | Admitting: Emergency Medicine

## 2020-01-16 ENCOUNTER — Encounter (HOSPITAL_BASED_OUTPATIENT_CLINIC_OR_DEPARTMENT_OTHER): Payer: Self-pay | Admitting: *Deleted

## 2020-01-16 ENCOUNTER — Emergency Department (HOSPITAL_BASED_OUTPATIENT_CLINIC_OR_DEPARTMENT_OTHER): Payer: BC Managed Care – PPO

## 2020-01-16 ENCOUNTER — Other Ambulatory Visit: Payer: Self-pay

## 2020-01-16 DIAGNOSIS — R531 Weakness: Secondary | ICD-10-CM | POA: Insufficient documentation

## 2020-01-16 DIAGNOSIS — E119 Type 2 diabetes mellitus without complications: Secondary | ICD-10-CM | POA: Diagnosis not present

## 2020-01-16 DIAGNOSIS — Z794 Long term (current) use of insulin: Secondary | ICD-10-CM | POA: Insufficient documentation

## 2020-01-16 DIAGNOSIS — R112 Nausea with vomiting, unspecified: Secondary | ICD-10-CM

## 2020-01-16 DIAGNOSIS — R05 Cough: Secondary | ICD-10-CM | POA: Diagnosis not present

## 2020-01-16 DIAGNOSIS — F17200 Nicotine dependence, unspecified, uncomplicated: Secondary | ICD-10-CM | POA: Diagnosis not present

## 2020-01-16 DIAGNOSIS — R630 Anorexia: Secondary | ICD-10-CM | POA: Insufficient documentation

## 2020-01-16 DIAGNOSIS — R1084 Generalized abdominal pain: Secondary | ICD-10-CM | POA: Diagnosis not present

## 2020-01-16 DIAGNOSIS — Z79899 Other long term (current) drug therapy: Secondary | ICD-10-CM | POA: Insufficient documentation

## 2020-01-16 LAB — COMPREHENSIVE METABOLIC PANEL
ALT: 29 U/L (ref 0–44)
AST: 30 U/L (ref 15–41)
Albumin: 3.6 g/dL (ref 3.5–5.0)
Alkaline Phosphatase: 76 U/L (ref 38–126)
Anion gap: 8 (ref 5–15)
BUN: 14 mg/dL (ref 6–20)
CO2: 27 mmol/L (ref 22–32)
Calcium: 8.9 mg/dL (ref 8.9–10.3)
Chloride: 100 mmol/L (ref 98–111)
Creatinine, Ser: 0.46 mg/dL (ref 0.44–1.00)
GFR calc Af Amer: >60 mL/min (ref >60)
GFR calc non Af Amer: >60 mL/min (ref >60)
Glucose, Bld: 230 mg/dL — ABNORMAL HIGH (ref 70–99)
Potassium: 4 mmol/L (ref 3.5–5.1)
Sodium: 135 mmol/L (ref 135–145)
Total Bilirubin: 0.5 mg/dL (ref 0.3–1.2)
Total Protein: 7.3 g/dL (ref 6.5–8.1)

## 2020-01-16 LAB — URINALYSIS, ROUTINE W REFLEX MICROSCOPIC
Bilirubin Urine: NEGATIVE
Glucose, UA: 250 mg/dL — AB
Ketones, ur: NEGATIVE mg/dL
Leukocytes,Ua: NEGATIVE
Nitrite: NEGATIVE
Protein, ur: NEGATIVE mg/dL
Specific Gravity, Urine: >1.03 — ABNORMAL HIGH (ref 1.005–1.030)
pH: 5.5 (ref 5.0–8.0)

## 2020-01-16 LAB — CBC WITH DIFFERENTIAL/PLATELET
Abs Immature Granulocytes: 0.03 10*3/uL (ref 0.00–0.07)
Basophils Absolute: 0 10*3/uL (ref 0.0–0.1)
Basophils Relative: 0 %
Eosinophils Absolute: 0.2 10*3/uL (ref 0.0–0.5)
Eosinophils Relative: 2 %
HCT: 42.3 % (ref 36.0–46.0)
Hemoglobin: 14.2 g/dL (ref 12.0–15.0)
Immature Granulocytes: 0 %
Lymphocytes Relative: 25 %
Lymphs Abs: 2.2 10*3/uL (ref 0.7–4.0)
MCH: 28.9 pg (ref 26.0–34.0)
MCHC: 33.6 g/dL (ref 30.0–36.0)
MCV: 86.2 fL (ref 80.0–100.0)
Monocytes Absolute: 0.5 10*3/uL (ref 0.1–1.0)
Monocytes Relative: 6 %
Neutro Abs: 5.7 10*3/uL (ref 1.7–7.7)
Neutrophils Relative %: 67 %
Platelets: 281 10*3/uL (ref 150–400)
RBC: 4.91 MIL/uL (ref 3.87–5.11)
RDW: 13.4 % (ref 11.5–15.5)
WBC: 8.6 10*3/uL (ref 4.0–10.5)
nRBC: 0 % (ref 0.0–0.2)

## 2020-01-16 LAB — URINALYSIS, MICROSCOPIC (REFLEX)

## 2020-01-16 LAB — LIPASE, BLOOD: Lipase: 33 U/L (ref 11–51)

## 2020-01-16 MED ORDER — SODIUM CHLORIDE 0.9 % IV BOLUS
1000.0000 mL | Freq: Once | INTRAVENOUS | Status: AC
  Administered 2020-01-16: 1000 mL via INTRAVENOUS

## 2020-01-16 MED ORDER — ONDANSETRON HCL 4 MG/2ML IJ SOLN
4.0000 mg | Freq: Once | INTRAMUSCULAR | Status: AC
  Administered 2020-01-16: 4 mg via INTRAVENOUS
  Filled 2020-01-16: qty 2

## 2020-01-16 MED ORDER — PROMETHAZINE HCL 25 MG RE SUPP: 25.0000 mg | Freq: Four times a day (QID) | RECTAL | 0 refills | Status: DC | PRN

## 2020-01-16 MED ORDER — MORPHINE SULFATE (PF) 4 MG/ML IV SOLN
4.0000 mg | Freq: Once | INTRAVENOUS | Status: AC
  Administered 2020-01-16: 4 mg via INTRAVENOUS
  Filled 2020-01-16: qty 1

## 2020-01-16 MED ORDER — DICYCLOMINE HCL 20 MG PO TABS: 20.0000 mg | ORAL_TABLET | Freq: Two times a day (BID) | ORAL | 0 refills | Status: DC

## 2020-01-16 NOTE — Discharge Instructions (Signed)
Clear liquid diet, advance slowly as tolerated. Take Phenergan as needed as prescribed for vomiting. Bentyl as prescribed for abdominal pain. Take Colace to help soften stools and MiraLAX to help produce bowel movements. Recheck with your primary care provider.

## 2020-01-16 NOTE — ED Provider Notes (Signed)
[facility] EMERGENCY DEPARTMENT Provider Note   CSN: [id number] Arrival date & time: 01/16/20  1133     History Chief Complaint  Patient presents with  . Abdominal Pain    Jennifer Beltran is a 44 y.o. female.  44 year old female with past medical history of diabetes, fibromyalgia, lupus presents with complaint of URI symptoms onset 1 week ago, also with vomiting for the past week.  Patient now having sharp/stabbing lower abdominal pain that radiates up left and right side.  Patient is taking Zofran at home without relief of her vomiting.  Denies fevers, changes in bowel or bladder habits.  Prior abdominal surgery includes cholecystectomy.  Patient did have a negative rapid Covid test, has been vaccinated against COVID-19.  No other complaints or concerns today.        Past Medical History:  Diagnosis Date  . Diabetes mellitus without complication (HCC)   . Fibromyalgia   . Lupus (HCC)   . Sciatica     There are no problems to display for this patient.   Past Surgical History:  Procedure Laterality Date  . ABDOMINAL HYSTERECTOMY    . CHOLECYSTECTOMY    . TONSILLECTOMY    . TUBAL LIGATION       OB History   No obstetric history on file.     History reviewed. No pertinent family history.  Social History   Tobacco Use  . Smoking status: Current Some Day Smoker  . Smokeless tobacco: Never Used  Substance Use Topics  . Alcohol use: No  . Drug use: No    Home Medications Prior to Admission medications   Medication Sig Start Date End Date Taking? Authorizing Provider  Empagliflozin (JARDIANCE PO) Take by mouth.   Yes [provider]  gabapentin (NEURONTIN) 600 MG tablet Take 1,200 mg by mouth 3 (three) times daily.   Yes [provider]  glipiZIDE (GLUCOTROL) 5 MG tablet Take by mouth daily before breakfast.   Yes [provider]  insulin glargine (LANTUS) 100 UNIT/ML injection Inject into the skin at bedtime.   Yes  [provider]  lisinopril (PRINIVIL,ZESTRIL) 10 MG tablet Take 10 mg by mouth daily.   Yes [provider]  ondansetron (ZOFRAN) 4 MG tablet Take 1 tablet (4 mg total) by mouth every 6 (six) hours. 08/30/16  Yes Law, Waylan Boga, PA-C  oxycodone (OXY-IR) 5 MG capsule Take 10 mg by mouth every 4 (four) hours as needed.   Yes [provider]  ATORVASTATIN CALCIUM PO Take by mouth.    [provider]  dicyclomine (BENTYL) 20 MG tablet Take 1 tablet (20 mg total) by mouth 2 (two) times daily. 01/16/20   Jeannie Fend, PA-C  fluconazole (DIFLUCAN) 200 MG tablet Take 1 tablet for yeast infection, repeat in 72 hours if continuing symptoms. 06/05/18   Alvira Monday, MD  promethazine (PHENERGAN) 25 MG suppository Place 1 suppository (25 mg total) rectally every 6 (six) hours as needed for nausea or vomiting. 01/16/20   Jeannie Fend, PA-C    Allergies    Monistat [tioconazole] and Penicillins  Review of Systems   Review of Systems  Constitutional: Negative for fever.  HENT: Positive for congestion and sore throat.   Respiratory: Positive for cough. Negative for shortness of breath.   Cardiovascular: Negative for chest pain.  Gastrointestinal: Positive for abdominal pain, nausea and vomiting. Negative for constipation and diarrhea.  Genitourinary: Negative for decreased urine volume, dysuria, frequency and hematuria.  Skin: Negative for  rash and wound.  Allergic/Immunologic: Positive for immunocompromised state.  Neurological: Positive for weakness.  Hematological: Negative for adenopathy.  Psychiatric/Behavioral: Negative for confusion.  All other systems reviewed and are negative.   Physical Exam Updated Vital Signs BP (!) 102/58 (BP Location: Right Arm)   Pulse 71   Temp 98.2 F (36.8 C) (Oral)   Resp 16   Ht 5\' 4"  (1.626 m)   Wt 99.8 kg   SpO2 95%   BMI 37.76 kg/m   Physical Exam Vitals and nursing note reviewed.  Constitutional:       General: She is not in acute distress.    Appearance: She is well-developed. She is not diaphoretic.  HENT:     Head: Normocephalic and atraumatic.  Cardiovascular:     Rate and Rhythm: Normal rate and regular rhythm.     Heart sounds: Normal heart sounds.  Pulmonary:     Effort: Pulmonary effort is normal.     Breath sounds: Normal breath sounds.  Abdominal:     Palpations: Abdomen is soft.     Tenderness: There is generalized abdominal tenderness. There is no right CVA tenderness or left CVA tenderness.  Skin:    General: Skin is warm and dry.     Findings: No erythema or rash.  Neurological:     Mental Status: She is alert and oriented to person, place, and time.  Psychiatric:        Behavior: Behavior normal.     ED Results / Procedures / Treatments   Labs (all labs ordered are listed, but only abnormal results are displayed) Labs Reviewed  URINALYSIS, ROUTINE W REFLEX MICROSCOPIC - Abnormal; Notable for the following components:      Result Value   APPearance HAZY (*)    Specific Gravity, Urine >1.030 (*)    Glucose, UA 250 (*)    Hgb urine dipstick LARGE (*)    All other components within normal limits  COMPREHENSIVE METABOLIC PANEL - Abnormal; Notable for the following components:   Glucose, Bld 230 (*)    All other components within normal limits  URINALYSIS, MICROSCOPIC (REFLEX) - Abnormal; Notable for the following components:   Bacteria, UA FEW (*)    All other components within normal limits  CBC WITH DIFFERENTIAL/PLATELET  LIPASE, BLOOD    EKG None  Radiology DG Chest Port 1 View  Result Date: 01/16/2020 CLINICAL DATA:  Cough EXAM: PORTABLE CHEST 1 VIEW COMPARISON:  02/18/2015 chest radiograph. FINDINGS: The heart size and mediastinal contours are within normal limits. Prominent appearance of the central pulmonary vessels is unchanged. Both lungs are clear. No acute osseous abnormality. IMPRESSION: No acute airspace disease. Electronically Signed   By:  04/21/2015 M.D.   On: 01/16/2020 12:58   CT Renal Stone Study  Result Date: 01/16/2020 CLINICAL DATA:  Cough, abd pain, n/v since last week. Hx renal stones. EXAM: CT ABDOMEN AND PELVIS WITHOUT CONTRAST TECHNIQUE: Multidetector CT imaging of the abdomen and pelvis was performed following the standard protocol without IV contrast. COMPARISON:  CT abdomen pelvis 01/09/2018 FINDINGS: Lower chest: Minimal dependent atelectasis. Evaluation of the abdominal viscera somewhat limited by the lack of IV contrast. Hepatobiliary: No focal liver abnormality is seen. Status post cholecystectomy. No biliary dilatation. Pancreas: Unremarkable. No surrounding inflammatory changes. Spleen: Normal in size without focal abnormality. Adrenals/Urinary Tract: Adrenal glands are unremarkable. Kidneys are symmetric in size. No hydronephrosis or renal calculi. Urinary bladder is unremarkable. Stomach/Bowel: Stomach is within normal limits. Appendix appears normal. No  evidence of bowel wall thickening, distention, or inflammatory changes. There is stool throughout the colon. Vascular/Lymphatic: No significant vascular findings are present. No enlarged abdominal or pelvic lymph nodes. Reproductive: Status post hysterectomy. No adnexal masses. Other: No abdominal wall hernia or abnormality. No abdominopelvic ascites. Musculoskeletal: No acute or significant osseous findings. IMPRESSION: 1. No acute intra-abdominal pathology on a noncontrast scan. 2. Heavy stool burden. Electronically Signed   By: Audie Pinto M.D.   On: 01/16/2020 13:58    Procedures Procedures (including critical care time)  Medications Ordered in ED Medications  sodium chloride 0.9 % bolus 1,000 mL ( Intravenous Stopped 01/16/20 1348)  ondansetron (ZOFRAN) injection 4 mg (4 mg Intravenous Given 01/16/20 1228)  morphine 4 MG/ML injection 4 mg (4 mg Intravenous Given 01/16/20 1228)    ED Course  I have reviewed the triage vital signs and the nursing  notes.  Pertinent labs & imaging results that were available during my care of the patient were reviewed by me and considered in my medical decision making (see chart for details).  Clinical Course as of Jan 15 1429  Wed Jan 15, 3682  2986 44 year old female with complaint of vomiting abdominal pain with URI symptoms.  No changes in bowel or bladder habits.  On exam, patient appears uncomfortable with generalized abdominal tenderness. Review of lab work, CBC unremarkable, CMP with mildly elevated glucose at 230.  Urinalysis is positive for large hemoglobin, 21-50 red blood cells, without evidence of urinary tract infection, lipase normal. CT noncontrast abdomen pelvis is negative for significant acute findings, does show a large stool burden. Patient now states that she has several people in her home who have also had vomiting.  Discussed possible viral illness, will give prescription for Phenergan as Zofran is not helping her may be contributing to her constipation.  Also given prescription for Bentyl.  Patient can take MiraLAX and Colace to produce bowel movements and recommend follow-up with PCP.   [LM]    Clinical Course User Index [LM] Jennifer Beltran   MDM Rules/Calculators/A&P                      Final Clinical Impression(s) / ED Diagnoses Final diagnoses:  Generalized abdominal pain  Non-intractable vomiting with nausea, unspecified vomiting type    Rx / DC Orders ED Discharge Orders         Ordered    promethazine (PHENERGAN) 25 MG suppository  Every 6 hours PRN     01/16/20 1412    dicyclomine (BENTYL) 20 MG tablet  2 times daily     01/16/20 1412           Tacy Learn, PA-C 01/16/20 [place], [place], DO 01/16/20 1452

## 2020-01-16 NOTE — ED Triage Notes (Signed)
Presents with abd pain , was sick last week per pt statement having a cough, having some nausea and vomiting, poor appetite, has taken Zofran tabs without relief

## 2020-09-28 ENCOUNTER — Emergency Department (HOSPITAL_BASED_OUTPATIENT_CLINIC_OR_DEPARTMENT_OTHER): Payer: 59

## 2020-09-28 ENCOUNTER — Other Ambulatory Visit: Payer: Self-pay

## 2020-09-28 ENCOUNTER — Inpatient Hospital Stay (HOSPITAL_BASED_OUTPATIENT_CLINIC_OR_DEPARTMENT_OTHER)
Admission: EM | Admit: 2020-09-28 | Discharge: 2020-09-30 | DRG: 917 | Disposition: A | Discharge status: Home / Self Care | Payer: 59 | Attending: Internal Medicine | Admitting: Internal Medicine

## 2020-09-28 ENCOUNTER — Encounter (HOSPITAL_BASED_OUTPATIENT_CLINIC_OR_DEPARTMENT_OTHER): Payer: Self-pay | Admitting: *Deleted

## 2020-09-28 DIAGNOSIS — E0781 Sick-euthyroid syndrome: Secondary | ICD-10-CM | POA: Diagnosis present

## 2020-09-28 DIAGNOSIS — F17210 Nicotine dependence, cigarettes, uncomplicated: Secondary | ICD-10-CM | POA: Diagnosis present

## 2020-09-28 DIAGNOSIS — E119 Type 2 diabetes mellitus without complications: Secondary | ICD-10-CM | POA: Diagnosis present

## 2020-09-28 DIAGNOSIS — T50901A Poisoning by unspecified drugs, medicaments and biological substances, accidental (unintentional), initial encounter: Secondary | ICD-10-CM | POA: Diagnosis not present

## 2020-09-28 DIAGNOSIS — Z8616 Personal history of COVID-19: Secondary | ICD-10-CM

## 2020-09-28 DIAGNOSIS — Z9049 Acquired absence of other specified parts of digestive tract: Secondary | ICD-10-CM

## 2020-09-28 DIAGNOSIS — M329 Systemic lupus erythematosus, unspecified: Secondary | ICD-10-CM | POA: Diagnosis present

## 2020-09-28 DIAGNOSIS — E876 Hypokalemia: Secondary | ICD-10-CM | POA: Diagnosis present

## 2020-09-28 DIAGNOSIS — M5430 Sciatica, unspecified side: Secondary | ICD-10-CM | POA: Diagnosis present

## 2020-09-28 DIAGNOSIS — Z794 Long term (current) use of insulin: Secondary | ICD-10-CM

## 2020-09-28 DIAGNOSIS — Z9109 Other allergy status, other than to drugs and biological substances: Secondary | ICD-10-CM

## 2020-09-28 DIAGNOSIS — Z79899 Other long term (current) drug therapy: Secondary | ICD-10-CM

## 2020-09-28 DIAGNOSIS — M797 Fibromyalgia: Secondary | ICD-10-CM | POA: Diagnosis present

## 2020-09-28 DIAGNOSIS — Z885 Allergy status to narcotic agent status: Secondary | ICD-10-CM

## 2020-09-28 DIAGNOSIS — T402X1A Poisoning by other opioids, accidental (unintentional), initial encounter: Secondary | ICD-10-CM | POA: Diagnosis not present

## 2020-09-28 DIAGNOSIS — Z20822 Contact with and (suspected) exposure to covid-19: Secondary | ICD-10-CM | POA: Diagnosis present

## 2020-09-28 DIAGNOSIS — N179 Acute kidney failure, unspecified: Secondary | ICD-10-CM | POA: Diagnosis present

## 2020-09-28 DIAGNOSIS — R0902 Hypoxemia: Secondary | ICD-10-CM | POA: Diagnosis present

## 2020-09-28 DIAGNOSIS — E041 Nontoxic single thyroid nodule: Secondary | ICD-10-CM

## 2020-09-28 DIAGNOSIS — T40601A Poisoning by unspecified narcotics, accidental (unintentional), initial encounter: Secondary | ICD-10-CM | POA: Diagnosis present

## 2020-09-28 DIAGNOSIS — G928 Other toxic encephalopathy: Secondary | ICD-10-CM | POA: Diagnosis present

## 2020-09-28 DIAGNOSIS — Z880 Allergy status to penicillin: Secondary | ICD-10-CM

## 2020-09-28 DIAGNOSIS — Z7984 Long term (current) use of oral hypoglycemic drugs: Secondary | ICD-10-CM

## 2020-09-28 DIAGNOSIS — I10 Essential (primary) hypertension: Secondary | ICD-10-CM | POA: Diagnosis present

## 2020-09-28 DIAGNOSIS — Z90710 Acquired absence of both cervix and uterus: Secondary | ICD-10-CM

## 2020-09-28 LAB — CBC WITH DIFFERENTIAL/PLATELET
Abs Immature Granulocytes: 0.06 10*3/uL (ref 0.00–0.07)
Basophils Absolute: 0.1 10*3/uL (ref 0.0–0.1)
Basophils Relative: 0 %
Eosinophils Absolute: 0.2 10*3/uL (ref 0.0–0.5)
Eosinophils Relative: 1 %
HCT: 42.7 % (ref 36.0–46.0)
Hemoglobin: 14.3 g/dL (ref 12.0–15.0)
Immature Granulocytes: 0 %
Lymphocytes Relative: 17 %
Lymphs Abs: 2.6 10*3/uL (ref 0.7–4.0)
MCH: 29.7 pg (ref 26.0–34.0)
MCHC: 33.5 g/dL (ref 30.0–36.0)
MCV: 88.8 fL (ref 80.0–100.0)
Monocytes Absolute: 0.9 10*3/uL (ref 0.1–1.0)
Monocytes Relative: 6 %
Neutro Abs: 12 10*3/uL — ABNORMAL HIGH (ref 1.7–7.7)
Neutrophils Relative %: 76 %
Platelets: 328 10*3/uL (ref 150–400)
RBC: 4.81 MIL/uL (ref 3.87–5.11)
RDW: 13.5 % (ref 11.5–15.5)
WBC: 15.8 10*3/uL — ABNORMAL HIGH (ref 4.0–10.5)
nRBC: 0 % (ref 0.0–0.2)

## 2020-09-28 LAB — RESP PANEL BY RT-PCR (FLU A&B, COVID) ARPGX2
Influenza A by PCR: NEGATIVE
Influenza B by PCR: NEGATIVE
SARS Coronavirus 2 by RT PCR: POSITIVE — AB

## 2020-09-28 LAB — COMPREHENSIVE METABOLIC PANEL
ALT: 26 U/L (ref 0–44)
AST: 30 U/L (ref 15–41)
Albumin: 3.7 g/dL (ref 3.5–5.0)
Alkaline Phosphatase: 99 U/L (ref 38–126)
Anion gap: 9 (ref 5–15)
BUN: 9 mg/dL (ref 6–20)
CO2: 28 mmol/L (ref 22–32)
Calcium: 8.7 mg/dL — ABNORMAL LOW (ref 8.9–10.3)
Chloride: 97 mmol/L — ABNORMAL LOW (ref 98–111)
Creatinine, Ser: 0.5 mg/dL (ref 0.44–1.00)
GFR, Estimated: >60 mL/min (ref >60)
Glucose, Bld: 145 mg/dL — ABNORMAL HIGH (ref 70–99)
Potassium: 2.8 mmol/L — ABNORMAL LOW (ref 3.5–5.1)
Sodium: 134 mmol/L — ABNORMAL LOW (ref 135–145)
Total Bilirubin: 0.4 mg/dL (ref 0.3–1.2)
Total Protein: 7.5 g/dL (ref 6.5–8.1)

## 2020-09-28 LAB — URINALYSIS, MICROSCOPIC (REFLEX)

## 2020-09-28 LAB — URINALYSIS, ROUTINE W REFLEX MICROSCOPIC
Bilirubin Urine: NEGATIVE
Glucose, UA: >=500 mg/dL — AB
Ketones, ur: NEGATIVE mg/dL
Leukocytes,Ua: NEGATIVE
Nitrite: NEGATIVE
Protein, ur: NEGATIVE mg/dL
Specific Gravity, Urine: 1.02 (ref 1.005–1.030)
pH: 5 (ref 5.0–8.0)

## 2020-09-28 LAB — I-STAT ARTERIAL BLOOD GAS, ED
Acid-Base Excess: 4 mmol/L — ABNORMAL HIGH (ref 0.0–2.0)
Bicarbonate: 30 mmol/L — ABNORMAL HIGH (ref 20.0–28.0)
Calcium, Ion: 1.23 mmol/L (ref 1.15–1.40)
HCT: 38 % (ref 36.0–46.0)
Hemoglobin: 12.9 g/dL (ref 12.0–15.0)
O2 Saturation: 98 %
Patient temperature: 98
Potassium: 2.9 mmol/L — ABNORMAL LOW (ref 3.5–5.1)
Sodium: 136 mmol/L (ref 135–145)
TCO2: 31 mmol/L (ref 22–32)
pCO2 arterial: 49.5 mmHg — ABNORMAL HIGH (ref 32.0–48.0)
pH, Arterial: 7.389 (ref 7.350–7.450)
pO2, Arterial: 104 mmHg (ref 83.0–108.0)

## 2020-09-28 LAB — PROTIME-INR
INR: 1 (ref 0.8–1.2)
Prothrombin Time: 12.3 seconds (ref 11.4–15.2)

## 2020-09-28 LAB — CBG MONITORING, ED
Glucose-Capillary: 143 mg/dL — ABNORMAL HIGH (ref 70–99)
Glucose-Capillary: 114 mg/dL — ABNORMAL HIGH (ref 70–99)

## 2020-09-28 LAB — RAPID URINE DRUG SCREEN, HOSP PERFORMED
Amphetamines: NOT DETECTED
Barbiturates: NOT DETECTED
Benzodiazepines: NOT DETECTED
Cocaine: NOT DETECTED
Opiates: POSITIVE — AB
Tetrahydrocannabinol: NOT DETECTED

## 2020-09-28 LAB — LACTIC ACID, PLASMA
Lactic Acid, Venous: 1 mmol/L (ref 0.5–1.9)
Lactic Acid, Venous: 0.6 mmol/L (ref 0.5–1.9)

## 2020-09-28 LAB — PREGNANCY, URINE: Preg Test, Ur: NEGATIVE

## 2020-09-28 MED ORDER — POTASSIUM CHLORIDE 10 MEQ/100ML IV SOLN
10.0000 meq | Freq: Once | INTRAVENOUS | Status: AC
  Administered 2020-09-28: 10 meq via INTRAVENOUS
  Filled 2020-09-28: qty 100

## 2020-09-28 MED ORDER — NALOXONE HCL 4 MG/10ML IJ SOLN
0.2500 mg/h | INTRAVENOUS | Status: DC
  Administered 2020-09-28: 0.25 mg/h via INTRAVENOUS
  Filled 2020-09-28: qty 10

## 2020-09-28 MED ORDER — NALOXONE HCL 2 MG/2ML IJ SOSY
PREFILLED_SYRINGE | INTRAMUSCULAR | Status: AC
  Filled 2020-09-28: qty 4

## 2020-09-28 MED ORDER — NALOXONE HCL 2 MG/2ML IJ SOSY
1.0000 mg | PREFILLED_SYRINGE | Freq: Once | INTRAMUSCULAR | Status: AC
  Administered 2020-09-28: 1 mg via INTRAVENOUS
  Filled 2020-09-28: qty 2

## 2020-09-28 MED ORDER — SODIUM CHLORIDE 0.9 % IV SOLN: INTRAVENOUS | Status: DC | PRN

## 2020-09-28 MED ORDER — NALOXONE HCL 0.4 MG/ML IJ SOLN: 0.4000 mg | Freq: Once | INTRAMUSCULAR | Status: DC

## 2020-09-28 MED ORDER — IOHEXOL 350 MG/ML SOLN
100.0000 mL | Freq: Once | INTRAVENOUS | Status: AC
  Administered 2020-09-28: 100 mL via INTRAVENOUS

## 2020-09-28 NOTE — ED Notes (Signed)
Asleep, but more easily to arouse since Narcan infusion started.

## 2020-09-28 NOTE — ED Triage Notes (Signed)
Pt brought  in by EMS from [facility]- After registration pt noted to be slumped over in wheelchair. Arousable to shaking but then drifts off and closes eyes. She states she has been fatigued since dx with covid in January. O2 sats 87% on room air. Pt taken to 11 to complete triage and placed on 2L [place]

## 2020-09-28 NOTE — ED Triage Notes (Signed)
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 She tells [place] she was dx with COVID ~3-4 weeks ago. CBG 175 per EMS. Pt. Is awake and oriented x 4. My colleague, Joss will finish her triage shortly.

## 2020-09-28 NOTE — ED Notes (Signed)
Asleep, but arousable, stated she took a dose of her pain meds before she left home.

## 2020-09-28 NOTE — ED Notes (Signed)
Patient woke up immediately after given Narcan IV. Complains with being cold.

## 2020-09-28 NOTE — ED Provider Notes (Signed)
[facility] EMERGENCY DEPARTMENT Provider Note   CSN: [id number] Arrival date & time: 09/28/20  1722     History Chief Complaint  Patient presents with  . Altered Mental Status   LEVEL 5 CAVEAT - AMS  Jennifer Beltran is a 45 y.o. female with PMHx Diabetes, Fibromyalgia, Lupus, and Sciatica who initially presents to the ED via EMS for fatigue. After registration pt was noted to be slumped over in wheelchair - arousable to shaking however then would drift off with closed eyes. She was also found to be hypoxic at 87% on RA and her chief complaint was changed to altered mental status. She had told EMS that she had COVID 3-4 weeks ago and has been fatigued since then.   Pt reports she has been having some nausea as well as SOB. She states she had COVID 1/26 and had gone back to work on 2/08. She is easily arousable and able to follow commands however does need redirecting.   The history is provided by the patient, medical records and the EMS personnel.       Past Medical History:  Diagnosis Date  . Diabetes mellitus without complication (HCC)   . Fibromyalgia   . Lupus (HCC)   . Sciatica     There are no problems to display for this patient.   Past Surgical History:  Procedure Laterality Date  . ABDOMINAL HYSTERECTOMY    . CHOLECYSTECTOMY    . TONSILLECTOMY    . TUBAL LIGATION       OB History   No obstetric history on file.     No family history on file.  Social History   Tobacco Use  . Smoking status: Current Some Day Smoker    Types: Cigarettes  . Smokeless tobacco: Never Used  Substance Use Topics  . Alcohol use: Not Currently  . Drug use: No    Home Medications Prior to Admission medications   Medication Sig Start Date End Date Taking? Authorizing Provider  ATORVASTATIN CALCIUM PO Take by mouth.    [provider]  dicyclomine (BENTYL) 20 MG tablet Take 1 tablet (20 mg total) by mouth 2 (two) times daily. 01/16/20   Jeannie FendMurphy, Laura A,  PA-C  Empagliflozin (JARDIANCE PO) Take by mouth.    [provider]  fluconazole (DIFLUCAN) 200 MG tablet Take 1 tablet for yeast infection, repeat in 72 hours if continuing symptoms. 06/05/18   Alvira MondaySchlossman, Erin, MD  gabapentin (NEURONTIN) 600 MG tablet Take 1,200 mg by mouth 3 (three) times daily.    [provider]  glipiZIDE (GLUCOTROL) 5 MG tablet Take by mouth daily before breakfast.    [provider]  insulin glargine (LANTUS) 100 UNIT/ML injection Inject into the skin at bedtime.    [provider]  lisinopril (PRINIVIL,ZESTRIL) 10 MG tablet Take 10 mg by mouth daily.    [provider]  ondansetron (ZOFRAN) 4 MG tablet Take 1 tablet (4 mg total) by mouth every 6 (six) hours. 08/30/16   Law, Waylan BogaAlexandra M, PA-C  oxycodone (OXY-IR) 5 MG capsule Take 10 mg by mouth every 4 (four) hours as needed.    [provider]  promethazine (PHENERGAN) 25 MG suppository Place 1 suppository (25 mg total) rectally every 6 (six) hours as needed for nausea or vomiting. 01/16/20   Jeannie FendMurphy, Laura A, PA-C    Allergies    Monistat [tioconazole], Penicillins, and Morphine  Review of Systems   Review of Systems  Unable to perform ROS: Mental  status change  Constitutional: Positive for fatigue.  Respiratory: Positive for shortness of breath.   Cardiovascular: Positive for chest pain.  Gastrointestinal: Positive for nausea.    Physical Exam Updated Vital Signs BP 117/67   Pulse 78   Temp 98 F (36.7 C) (Oral)   Ht 5\' 4"  (1.626 m)   Wt 96 kg   SpO2 97%   BMI 36.33 kg/m   Physical Exam Vitals and nursing note reviewed.  Constitutional:      Appearance: She is not ill-appearing or diaphoretic.  HENT:     Head: Normocephalic and atraumatic.  Eyes:     Conjunctiva/sclera: Conjunctivae normal.  Cardiovascular:     Rate and Rhythm: Normal rate and regular rhythm.     Pulses: Normal pulses.  Pulmonary:     Effort: Pulmonary effort is normal.      Breath sounds: Normal breath sounds. No wheezing, rhonchi or rales.     Comments: Currently on 2L satting 97%. LCTAB.  Abdominal:     Palpations: Abdomen is soft.     Tenderness: There is no abdominal tenderness. There is no guarding or rebound.  Musculoskeletal:     Cervical back: Neck supple.  Skin:    General: Skin is warm and dry.  Neurological:     Comments: Drowsy on exam. Drifts in and out of sleep. Easily arousable with verbal stimuli and can follow commands accordingly. Alert and oriented x 4. CN II-XII intact. Strength 5/5 to BUE and BLEs.      ED Results / Procedures / Treatments   Labs (all labs ordered are listed, but only abnormal results are displayed) Labs Reviewed  RESP PANEL BY RT-PCR (FLU A&B, COVID) ARPGX2 - Abnormal; Notable for the following components:      Result Value   SARS Coronavirus 2 by RT PCR POSITIVE (*)    All other components within normal limits  COMPREHENSIVE METABOLIC PANEL - Abnormal; Notable for the following components:   Sodium 134 (*)    Potassium 2.8 (*)    Chloride 97 (*)    Glucose, Bld 145 (*)    Calcium 8.7 (*)    All other components within normal limits  CBC WITH DIFFERENTIAL/PLATELET - Abnormal; Notable for the following components:   WBC 15.8 (*)    Neutro Abs 12.0 (*)    All other components within normal limits  URINALYSIS, ROUTINE W REFLEX MICROSCOPIC - Abnormal; Notable for the following components:   Glucose, UA >=500 (*)    Hgb urine dipstick MODERATE (*)    All other components within normal limits  RAPID URINE DRUG SCREEN, HOSP PERFORMED - Abnormal; Notable for the following components:   Opiates POSITIVE (*)    All other components within normal limits  URINALYSIS, MICROSCOPIC (REFLEX) - Abnormal; Notable for the following components:   Bacteria, UA FEW (*)    All other components within normal limits  CBG MONITORING, ED - Abnormal; Notable for the following components:   Glucose-Capillary 143 (*)    All other  components within normal limits  I-STAT ARTERIAL BLOOD GAS, ED - Abnormal; Notable for the following components:   pCO2 arterial 49.5 (*)    Bicarbonate 30.0 (*)    Acid-Base Excess 4.0 (*)    Potassium 2.9 (*)    All other components within normal limits  CBG MONITORING, ED - Abnormal; Notable for the following components:   Glucose-Capillary 114 (*)    All other components within normal limits  CULTURE, BLOOD (ROUTINE X  2)  CULTURE, BLOOD (ROUTINE X 2)  LACTIC ACID, PLASMA  LACTIC ACID, PLASMA  PROTIME-INR  PREGNANCY, URINE    EKG None  Radiology CT Head Wo Contrast  Result Date: 09/28/2020 CLINICAL DATA:  Pt brought in by EMS from [facility]- After registration pt noted to be slumped over in wheelchair. Arousable to shaking but then drifts off and closes eyes. She states she has been fatigued since dx with covid in January. O2 sats 87% on room air. Pt taken to 11 to complete triage and placed on 2L [place] EXAM: CT HEAD WITHOUT CONTRAST TECHNIQUE: Contiguous axial images were obtained from the base of the skull through the vertex without intravenous contrast. COMPARISON:  02/13/2016 FINDINGS: Brain: No evidence of acute infarction, hemorrhage, hydrocephalus, extra-axial collection or mass lesion/mass effect. Vascular: No hyperdense vessel or unexpected calcification. Skull: Normal. Negative for fracture or focal lesion. Sinuses/Orbits: Normal globes and orbits. Visualized sinuses are clear Other: None. IMPRESSION: Normal unenhanced CT scan of the brain. Electronically Signed   By: Amie [place] M.D.   On: 09/28/2020 19:56   CT Angio Chest PE W/Cm &/Or Wo Cm  Result Date: 09/28/2020 CLINICAL DATA:  PE suspected, high prob Shortness of breath.  COVID positive last month. EXAM: CT ANGIOGRAPHY CHEST WITH CONTRAST TECHNIQUE: Multidetector CT imaging of the chest was performed using the standard protocol during bolus administration of intravenous contrast. Multiplanar CT image reconstructions and  MIPs were obtained to evaluate the vascular anatomy. CONTRAST:  OMNIPAQUE IOHEXOL 350 MG/ML SOLN COMPARISON:  Radiograph earlier today. FINDINGS: Cardiovascular: There are no filling defects within the pulmonary arteries to suggest pulmonary embolus. Subsegmental branches are not well assessed due to contrast bolus timing. Normal caliber thoracic aorta without dissection or acute aortic findings. Heart is normal in size. No pericardial effusion. Mediastinum/Nodes: No enlarged mediastinal or hilar lymph nodes. There is a 16 mm hypodense left thyroid nodule. Tiny hiatal hernia, no esophageal wall thickening. Lungs/Pleura: Multifocal vague areas of ground-glass opacity throughout both lungs with occasional areas of septal thickening. Trachea and central bronchi are patent. No pleural fluid. Upper Abdomen: No acute or unexpected findings. Musculoskeletal: There are no acute or suspicious osseous abnormalities. Mild thoracic spondylosis Review of the MIP images confirms the above findings. IMPRESSION: 1. No pulmonary embolus. 2. Multifocal vague areas of ground-glass opacity throughout both lungs with occasional areas of septal thickening. Findings likely sequela of COVID-19 pneumonia. Pulmonary edema is also considered but felt mild less likely. 3. Left thyroid nodule measuring 16 mm. Recommend thyroid US on a nonemergent elective basis. (Ref: J Am Coll Radiol. 2015 Feb;12(2): 143-50). Electronically Signed   By: Narda Rutherford M.D.   On: 09/28/2020 20:01   DG Chest Port 1 View  Result Date: 09/28/2020 CLINICAL DATA:  Altered mental status and headache. Shortness of breath EXAM: PORTABLE CHEST 1 VIEW COMPARISON:  01/16/2020 FINDINGS: Low lung volumes are present, causing crowding of the pulmonary vasculature. Upper normal heart size. The lungs appear clear.  No blunting of the costophrenic angles. IMPRESSION: 1. Low lung volumes.  No acute findings. Electronically Signed   By: Gaylyn Rong M.D.   On:  09/28/2020 18:39    Procedures Procedures   Medications Ordered in ED Medications  0.9 %  sodium chloride infusion ( Intravenous New Bag/Given 09/28/20 1951)  naloxone HCl (NARCAN) 4 mg in dextrose 5 % 250 mL infusion (0.25 mg/hr Intravenous New Bag/Given 09/28/20 2053)  naloxone [facility]) injection 1 mg (1 mg Intravenous Given 09/28/20 1837)  potassium  chloride 10 mEq in 100 mL IVPB (0 mEq Intravenous Stopped 09/28/20 2050)  iohexol (OMNIPAQUE) 350 MG/ML injection 100 mL (100 mLs Intravenous Contrast Given 09/28/20 1929)    ED Course  I have reviewed the triage vital signs and the nursing notes.  Pertinent labs & imaging results that were available during my care of the patient were reviewed by me and considered in my medical decision making (see chart for details).    MDM Rules/Calculators/A&P                          45 year old female presenting to the ED vis EMS with complaint of fatigue s/p COVID 3-4 weeks ago however found to be altered on arrival with hypoxia at 87% on RA; placed on 2L. During my exam pt is drowsy however easily arousable to verbal stimuli. She is A&O x 4 and able to follow all commands with redirecting and continued verbal stimuli as she drifts in and out of sleep. Does appear that pt is on a large amount of narcotics - concern for overdose at this time. Will provide 1 mg Narcan. Will also work up for AMS/hypoxia with recent COVID infection. Question overdose vs PE vs Pneumonia vs intracranial abnormality. Will add on CTA Chest and CT Head. Will also obtain ABG at this time.   Pt had positive response to narcan. Will continue to monitor; concern for possible withdrawals given the amount of medication pt is on. She denies taking anything besides her prescribed medication which includes 10 mg Oxycodone IR and Xtampza ER 18 mg; last filled on 2/01.   ABG within normal limits. Potassium 2.9; will replete.  UDS positive for opiates CBC with leukocytosis 15.8; CXR clear  and urinalysis without infection. Lactic acid 1.0. Doubt infection today.  CMP with sodium 134 and potassium 2.8  CXR negative CT Head and CTA without acute abnormalities  On reevaluation after CT scan pt appears much more drowsy than initially. She is arousable to painful stimuli at this time; given this pt placed on Narcan drip per attending physician Dr. Kathrynn Running recommendations. Pt will require admission. We do not have a COVID test within our system and therefore one has been ordered -  Has returned positive however pt states she tested positive 1/26.   Initially discussed case with [facility] Dr. Antionette Char however given that pt is on a narcan drip she will require admission to ICU.   Attending physician Dr. Donnald Garre discussed case with PCCM who agrees to accept patient for admission.   This note was prepared using Dragon voice recognition software and may include unintentional dictation errors due to the inherent limitations of voice recognition software.   Final Clinical Impression(s) / ED Diagnoses Final diagnoses:  Accidental drug overdose, initial encounter    Rx / DC Orders ED Discharge Orders    None       Tanda Rockers, PA-C 09/28/20 2228    Arby Barrette, MD 10/06/20 1020

## 2020-09-28 NOTE — ED Notes (Signed)
With patient's permission spoke with her mother in the lobby to advise that patient was to be admitted and she is positive for COVID. No other information given.

## 2020-09-28 NOTE — ED Notes (Signed)
PA Hyman Hopes) aware that pt's covid test is positive

## 2020-09-28 NOTE — ED Notes (Signed)
Dr. Donnald Garre made aware of pt status

## 2020-09-29 DIAGNOSIS — E0781 Sick-euthyroid syndrome: Secondary | ICD-10-CM | POA: Diagnosis present

## 2020-09-29 DIAGNOSIS — Z794 Long term (current) use of insulin: Secondary | ICD-10-CM | POA: Diagnosis not present

## 2020-09-29 DIAGNOSIS — Z8616 Personal history of COVID-19: Secondary | ICD-10-CM | POA: Diagnosis not present

## 2020-09-29 DIAGNOSIS — Z7984 Long term (current) use of oral hypoglycemic drugs: Secondary | ICD-10-CM | POA: Diagnosis not present

## 2020-09-29 DIAGNOSIS — F17210 Nicotine dependence, cigarettes, uncomplicated: Secondary | ICD-10-CM | POA: Diagnosis present

## 2020-09-29 DIAGNOSIS — T40601S Poisoning by unspecified narcotics, accidental (unintentional), sequela: Secondary | ICD-10-CM | POA: Diagnosis not present

## 2020-09-29 DIAGNOSIS — T40601A Poisoning by unspecified narcotics, accidental (unintentional), initial encounter: Secondary | ICD-10-CM | POA: Diagnosis present

## 2020-09-29 DIAGNOSIS — T402X1A Poisoning by other opioids, accidental (unintentional), initial encounter: Secondary | ICD-10-CM | POA: Diagnosis present

## 2020-09-29 DIAGNOSIS — Z90710 Acquired absence of both cervix and uterus: Secondary | ICD-10-CM | POA: Diagnosis not present

## 2020-09-29 DIAGNOSIS — M797 Fibromyalgia: Secondary | ICD-10-CM | POA: Diagnosis present

## 2020-09-29 DIAGNOSIS — E119 Type 2 diabetes mellitus without complications: Secondary | ICD-10-CM | POA: Diagnosis present

## 2020-09-29 DIAGNOSIS — G928 Other toxic encephalopathy: Secondary | ICD-10-CM | POA: Diagnosis present

## 2020-09-29 DIAGNOSIS — Z79899 Other long term (current) drug therapy: Secondary | ICD-10-CM | POA: Diagnosis not present

## 2020-09-29 DIAGNOSIS — I10 Essential (primary) hypertension: Secondary | ICD-10-CM | POA: Diagnosis present

## 2020-09-29 DIAGNOSIS — T50904S Poisoning by unspecified drugs, medicaments and biological substances, undetermined, sequela: Secondary | ICD-10-CM

## 2020-09-29 DIAGNOSIS — Z885 Allergy status to narcotic agent status: Secondary | ICD-10-CM | POA: Diagnosis not present

## 2020-09-29 DIAGNOSIS — Z9049 Acquired absence of other specified parts of digestive tract: Secondary | ICD-10-CM | POA: Diagnosis not present

## 2020-09-29 DIAGNOSIS — N179 Acute kidney failure, unspecified: Secondary | ICD-10-CM | POA: Diagnosis present

## 2020-09-29 DIAGNOSIS — G9349 Other encephalopathy: Secondary | ICD-10-CM

## 2020-09-29 DIAGNOSIS — E876 Hypokalemia: Secondary | ICD-10-CM | POA: Diagnosis present

## 2020-09-29 DIAGNOSIS — R4182 Altered mental status, unspecified: Secondary | ICD-10-CM | POA: Diagnosis not present

## 2020-09-29 DIAGNOSIS — M5430 Sciatica, unspecified side: Secondary | ICD-10-CM | POA: Diagnosis present

## 2020-09-29 DIAGNOSIS — R0902 Hypoxemia: Secondary | ICD-10-CM | POA: Diagnosis present

## 2020-09-29 DIAGNOSIS — M329 Systemic lupus erythematosus, unspecified: Secondary | ICD-10-CM | POA: Diagnosis present

## 2020-09-29 DIAGNOSIS — T50901A Poisoning by unspecified drugs, medicaments and biological substances, accidental (unintentional), initial encounter: Secondary | ICD-10-CM | POA: Diagnosis present

## 2020-09-29 DIAGNOSIS — Z9109 Other allergy status, other than to drugs and biological substances: Secondary | ICD-10-CM | POA: Diagnosis not present

## 2020-09-29 DIAGNOSIS — E041 Nontoxic single thyroid nodule: Secondary | ICD-10-CM | POA: Diagnosis present

## 2020-09-29 DIAGNOSIS — Z20822 Contact with and (suspected) exposure to covid-19: Secondary | ICD-10-CM | POA: Diagnosis present

## 2020-09-29 DIAGNOSIS — Z880 Allergy status to penicillin: Secondary | ICD-10-CM | POA: Diagnosis not present

## 2020-09-29 LAB — MAGNESIUM: Magnesium: 1.9 mg/dL (ref 1.7–2.4)

## 2020-09-29 LAB — HIV ANTIBODY (ROUTINE TESTING W REFLEX): HIV Screen 4th Generation wRfx: NONREACTIVE

## 2020-09-29 LAB — PROCALCITONIN: Procalcitonin: <0.1 ng/mL

## 2020-09-29 LAB — POTASSIUM: Potassium: 3.3 mmol/L — ABNORMAL LOW (ref 3.5–5.1)

## 2020-09-29 LAB — GLUCOSE, CAPILLARY
Glucose-Capillary: 105 mg/dL — ABNORMAL HIGH (ref 70–99)
Glucose-Capillary: 101 mg/dL — ABNORMAL HIGH (ref 70–99)
Glucose-Capillary: 112 mg/dL — ABNORMAL HIGH (ref 70–99)
Glucose-Capillary: 141 mg/dL — ABNORMAL HIGH (ref 70–99)

## 2020-09-29 LAB — PHOSPHORUS: Phosphorus: 2.9 mg/dL (ref 2.5–4.6)

## 2020-09-29 LAB — TSH: TSH: 0.27 u[IU]/mL — ABNORMAL LOW (ref 0.350–4.500)

## 2020-09-29 LAB — MRSA PCR SCREENING: MRSA by PCR: NEGATIVE

## 2020-09-29 LAB — HEMOGLOBIN A1C
Hgb A1c MFr Bld: 9.6 % — ABNORMAL HIGH (ref 4.8–5.6)
Mean Plasma Glucose: 228.82 mg/dL

## 2020-09-29 LAB — CBG MONITORING, ED: Glucose-Capillary: 136 mg/dL — ABNORMAL HIGH (ref 70–99)

## 2020-09-29 MED ORDER — POTASSIUM CHLORIDE 10 MEQ/100ML IV SOLN
10.0000 meq | INTRAVENOUS | Status: DC
  Administered 2020-09-29: 10 meq via INTRAVENOUS
  Filled 2020-09-29: qty 100

## 2020-09-29 MED ORDER — DOCUSATE SODIUM 100 MG PO CAPS
100.0000 mg | ORAL_CAPSULE | Freq: Two times a day (BID) | ORAL | Status: DC | PRN
Start: 2020-09-29 — End: 2020-09-30

## 2020-09-29 MED ORDER — CHLORHEXIDINE GLUCONATE CLOTH 2 % EX PADS
6.0000 | MEDICATED_PAD | Freq: Every day | CUTANEOUS | Status: DC
  Administered 2020-09-29 – 2020-09-30 (×2): 6 via TOPICAL

## 2020-09-29 MED ORDER — SODIUM CHLORIDE 0.45 % IV SOLN: INTRAVENOUS | Status: DC

## 2020-09-29 MED ORDER — CHLORHEXIDINE GLUCONATE CLOTH 2 % EX PADS: 6.0000 | MEDICATED_PAD | Freq: Every day | CUTANEOUS | Status: DC

## 2020-09-29 MED ORDER — ONDANSETRON HCL 4 MG/2ML IJ SOLN
4.0000 mg | Freq: Four times a day (QID) | INTRAMUSCULAR | Status: DC | PRN
  Administered 2020-09-29 (×2): 4 mg via INTRAVENOUS
  Filled 2020-09-29 (×2): qty 2

## 2020-09-29 MED ORDER — ENOXAPARIN SODIUM 40 MG/0.4ML ~~LOC~~ SOLN
40.0000 mg | Freq: Every day | SUBCUTANEOUS | Status: DC
  Administered 2020-09-29 – 2020-09-30 (×2): 40 mg via SUBCUTANEOUS
  Filled 2020-09-29 (×2): qty 0.4

## 2020-09-29 MED ORDER — INSULIN ASPART 100 UNIT/ML ~~LOC~~ SOLN
0.0000 [IU] | Freq: Three times a day (TID) | SUBCUTANEOUS | Status: DC
  Administered 2020-09-30: 3 [IU] via SUBCUTANEOUS

## 2020-09-29 MED ORDER — MAGNESIUM SULFATE 2 GM/50ML IV SOLN
2.0000 g | Freq: Once | INTRAVENOUS | Status: AC
  Administered 2020-09-29: 2 g via INTRAVENOUS
  Filled 2020-09-29: qty 50

## 2020-09-29 MED ORDER — POTASSIUM CHLORIDE CRYS ER 20 MEQ PO TBCR
40.0000 meq | EXTENDED_RELEASE_TABLET | Freq: Once | ORAL | Status: AC
  Administered 2020-09-29: 40 meq via ORAL
  Filled 2020-09-29: qty 2

## 2020-09-29 MED ORDER — POTASSIUM CHLORIDE CRYS ER 20 MEQ PO TBCR
20.0000 meq | EXTENDED_RELEASE_TABLET | ORAL | Status: DC
  Administered 2020-09-29: 20 meq via ORAL
  Filled 2020-09-29: qty 1

## 2020-09-29 MED ORDER — ENOXAPARIN SODIUM 40 MG/0.4ML ~~LOC~~ SOLN: 40.0000 mg | SUBCUTANEOUS | Status: DC

## 2020-09-29 MED ORDER — PANTOPRAZOLE SODIUM 40 MG IV SOLR: 40.0000 mg | Freq: Every day | INTRAVENOUS | Status: DC

## 2020-09-29 MED ORDER — GABAPENTIN 400 MG PO CAPS
800.0000 mg | ORAL_CAPSULE | Freq: Three times a day (TID) | ORAL | Status: DC
Start: 2020-09-29 — End: 2020-09-30
  Administered 2020-09-29 – 2020-09-30 (×5): 800 mg via ORAL
  Filled 2020-09-29 (×6): qty 2

## 2020-09-29 MED ORDER — INSULIN ASPART 100 UNIT/ML ~~LOC~~ SOLN: 0.0000 [IU] | Freq: Every day | SUBCUTANEOUS | Status: DC

## 2020-09-29 MED ORDER — POLYETHYLENE GLYCOL 3350 17 G PO PACK: 17.0000 g | PACK | Freq: Every day | ORAL | Status: DC | PRN

## 2020-09-29 MED ORDER — ATORVASTATIN CALCIUM 10 MG PO TABS
20.0000 mg | ORAL_TABLET | Freq: Every day | ORAL | Status: DC
  Administered 2020-09-29 – 2020-09-30 (×2): 20 mg via ORAL
  Filled 2020-09-29 (×2): qty 2

## 2020-09-29 NOTE — H&P (Signed)
NAME:  Jennifer Beltran, MRN:  [id number], DOB:  [birth year], LOS: 0 ADMISSION DATE:  09/28/2020, CONSULTATION DATE:  09/29/20 REFERRING MD: Hyman Hopes (ED) , CHIEF COMPLAINT:  Altered mental status  Brief History:  45 year old woman with DM, fibromyalgia, lupus, sciatica here with fatigue.  Difficult to arouse in ED.  Also found to be hypoxic.   History of Present Illness:  Found slumped over in wheelchair, minimally arousable.   Sat 87%.   Unclear if she has been taking more opiates.  Notably has AKI.   Recent COVID 19.    Past Medical History:  recetn hx of COVID (3-4 weeks ago) 1/26 DM2 Fibromyalgia  Sciatica  Lupus  Meds: lipitor, jardiance, diflucan, bentyl, gabapentin, glipizine, lantus, lisinopril, zofran oxycodone 10 mg q 4 prn , phenergan prn   Significant Hospital Events:    Consults:    Procedures:    Significant Diagnostic Tests:  CT head neg  Micro Data:    Antimicrobials:     Interim History / Subjective:    Objective   Blood pressure (!) 100/55, pulse 70, temperature 98.2 F (36.8 C), temperature source Oral, resp. rate 19, height 5\' 4"  (1.626 m), weight 96 kg, SpO2 97 %.        Intake/Output Summary (Last 24 hours) at 09/29/2020 0216 Last data filed at 09/29/2020 0200 Gross per 24 hour  Intake 558.5 ml  Output 600 ml  Net -41.5 ml   Filed Weights   09/28/20 1758  Weight: 96 kg    Examination: General: Arousable, fatigued HENT: NCAT Lungs: CTAB Cardiovascular: RRR no mgr  Abdomen: nt, nd, nbs Extremities: No edema, no tenderness Neuro: sleeping but easily arousable   Resolved Hospital Problem list     Assessment & Plan:  AMS/ Acute encephalopathy: appears 2/2 opiates given her improvement With narcan.  Continue to follow closely.    COVID: on 2 L [place]. Satting well.  Best practice (evaluated daily)  Diet: npo  Pain/Anxiety/Delirium protocol (if indicated):  VAP protocol (if indicated):  DVT prophylaxis: lovenox  GI  prophylaxis: protonix Glucose control:  Mobility: bed Disposition:  Goals of Care:  Last date of multidisciplinary goals of care discussion: Family and staff present:  Summary of discussion:  Follow up goals of care discussion due:  Code Status: Full   Labs   CBC: Recent Labs  Lab 09/28/20 1802 09/28/20 1847  WBC 15.8*  --   NEUTROABS 12.0*  --   HGB 14.3 12.9  HCT 42.7 38.0  MCV 88.8  --   PLT 328  --     Basic Metabolic Panel: Recent Labs  Lab 09/28/20 1802 09/28/20 1847  NA 134* 136  K 2.8* 2.9*  CL 97*  --   CO2 28  --   GLUCOSE 145*  --   BUN 9  --   CREATININE 0.50  --   CALCIUM 8.7*  --    GFR: Estimated Creatinine Clearance: 100.9 mL/min (by C-G formula based on SCr of 0.5 mg/dL). Recent Labs  Lab 09/28/20 1802 09/28/20 2020  WBC 15.8*  --   LATICACIDVEN 1.0 0.6    Liver Function Tests: Recent Labs  Lab 09/28/20 1802  AST 30  ALT 26  ALKPHOS 99  BILITOT 0.4  PROT 7.5  ALBUMIN 3.7   No results for input(s): LIPASE, AMYLASE in the last 168 hours. No results for input(s): AMMONIA in the last 168 hours.  ABG    Component Value Date/Time   PHART 7.389 09/28/2020  1847   PCO2ART 49.5 (H) 09/28/2020 1847   PO2ART 104 09/28/2020 1847   HCO3 30.0 (H) 09/28/2020 1847   TCO2 31 09/28/2020 1847   O2SAT 98.0 09/28/2020 1847     Coagulation Profile: Recent Labs  Lab 09/28/20 1802  INR 1.0    Cardiac Enzymes: No results for input(s): CKTOTAL, CKMB, CKMBINDEX, TROPONINI in the last 168 hours.  HbA1C: No results found for: HGBA1C  CBG: Recent Labs  Lab 09/28/20 1747 09/28/20 2019  GLUCAP 143* 114*    Review of Systems:     Past Medical History:  She,  has a past medical history of Diabetes mellitus without complication (HCC), Fibromyalgia, Lupus (HCC), and Sciatica.   Surgical History:   Past Surgical History:  Procedure Laterality Date  . ABDOMINAL HYSTERECTOMY    . CHOLECYSTECTOMY    . TONSILLECTOMY    . TUBAL  LIGATION       Social History:   reports that she has been smoking cigarettes. She has never used smokeless tobacco. She reports previous alcohol use. She reports that she does not use drugs.   Family History:  Her family history is not on file.   Allergies Allergies  Allergen Reactions  . Monistat [Tioconazole]     rash  . Penicillins Hives  . Morphine Itching     Home Medications  Prior to Admission medications   Medication Sig Start Date End Date Taking? Authorizing Provider  ATORVASTATIN CALCIUM PO Take by mouth.    [provider]  dicyclomine (BENTYL) 20 MG tablet Take 1 tablet (20 mg total) by mouth 2 (two) times daily. 01/16/20   Jeannie Fend, PA-C  Empagliflozin (JARDIANCE PO) Take by mouth.    [provider]  fluconazole (DIFLUCAN) 200 MG tablet Take 1 tablet for yeast infection, repeat in 72 hours if continuing symptoms. 06/05/18   Alvira Monday, MD  gabapentin (NEURONTIN) 600 MG tablet Take 1,200 mg by mouth 3 (three) times daily.    [provider]  glipiZIDE (GLUCOTROL) 5 MG tablet Take by mouth daily before breakfast.    [provider]  insulin glargine (LANTUS) 100 UNIT/ML injection Inject into the skin at bedtime.    [provider]  lisinopril (PRINIVIL,ZESTRIL) 10 MG tablet Take 10 mg by mouth daily.    [provider]  ondansetron (ZOFRAN) 4 MG tablet Take 1 tablet (4 mg total) by mouth every 6 (six) hours. 08/30/16   Law, Waylan Boga, PA-C  oxycodone (OXY-IR) 5 MG capsule Take 10 mg by mouth every 4 (four) hours as needed.    [provider]  promethazine (PHENERGAN) 25 MG suppository Place 1 suppository (25 mg total) rectally every 6 (six) hours as needed for nausea or vomiting. 01/16/20   Jeannie Fend, PA-C     Critical care time: 45 min

## 2020-09-29 NOTE — Plan of Care (Signed)
  Problem: Clinical Measurements: Goal: Ability to maintain clinical measurements within normal limits will improve Outcome: Progressing Goal: Will remain free from infection Outcome: Progressing   

## 2020-09-29 NOTE — Progress Notes (Signed)
45 year old female with chronic narcotic dependent, lupus, hypertension who was diagnosed with COVID on 1/26, who presented with fatigue, found minimally responsive in the ED with O2 sat in the 80s.  Requiring multiple doses of Narcan  At this time patient is alert, awake, following commands She is on 2 L nasal cannula oxygen with a stable O2 sat in 90s Narcan infusion was stopped  Patient will be transferred to MedSurg floor    Cheri Fowler MD [facility] See Amion for pager If no response to pager, please call [phone] until 7pm After 7pm, Please call E-link [phone]

## 2020-09-29 NOTE — ED Notes (Signed)
Now fully awake and alert.

## 2020-09-29 NOTE — Plan of Care (Signed)
Pt is on 2L [place], Aox4. Vitals are stable

## 2020-09-29 NOTE — Progress Notes (Signed)
eLink Physician-Brief Progress Note Patient Name: Jennifer Beltran DOB: [birth year] MRN: [id number]   Date of Service  09/29/2020  HPI/Events of Note  NEW admit review>  45 yr old female with hx of DM, fibromyalgia, Lupus, sciatica with recent covid + on 26 th,  Now somnolent/encephalopathy from Opiod overdose. In ICU on narcan gtt.  Camera: Alert and wake, on nasal o2. Getting narcan at 15    gtt. VS stable.  Discussed with bed side RN.   Data: Reviewed.  CTH no acyte findings Low K replaced from Ed already. LA normal. Pco2 at 49. BG 114.        eICU Interventions  - received only 10 meq once for low K from ED. Re check K/mag/po4 stat and  follow labs in AM - asp precautions - wean narcan as tolerated. - covid + still. Precautions to continue - watch for fever. Wbc 15 K. No abx for now.  - Lovenox as VTE ordered.      Intervention Category Major Interventions: Change in mental status - evaluation and management;Other: Evaluation Type: New Patient Evaluation  Ranee Gosselin 09/29/2020, 1:51 AM

## 2020-09-30 DIAGNOSIS — E041 Nontoxic single thyroid nodule: Secondary | ICD-10-CM | POA: Diagnosis not present

## 2020-09-30 DIAGNOSIS — T50901A Poisoning by unspecified drugs, medicaments and biological substances, accidental (unintentional), initial encounter: Secondary | ICD-10-CM

## 2020-09-30 DIAGNOSIS — T40601A Poisoning by unspecified narcotics, accidental (unintentional), initial encounter: Secondary | ICD-10-CM

## 2020-09-30 DIAGNOSIS — T40601S Poisoning by unspecified narcotics, accidental (unintentional), sequela: Secondary | ICD-10-CM | POA: Diagnosis not present

## 2020-09-30 LAB — BLOOD CULTURE ID PANEL (REFLEXED) - BCID2

## 2020-09-30 LAB — GLUCOSE, CAPILLARY: Glucose-Capillary: 163 mg/dL — ABNORMAL HIGH (ref 70–99)

## 2020-09-30 LAB — BASIC METABOLIC PANEL
Anion gap: 14 (ref 5–15)
BUN: 12 mg/dL (ref 6–20)
CO2: 19 mmol/L — ABNORMAL LOW (ref 22–32)
Calcium: 8.3 mg/dL — ABNORMAL LOW (ref 8.9–10.3)
Chloride: 105 mmol/L (ref 98–111)
Creatinine, Ser: 0.45 mg/dL (ref 0.44–1.00)
GFR, Estimated: >60 mL/min (ref >60)
Glucose, Bld: 117 mg/dL — ABNORMAL HIGH (ref 70–99)
Potassium: 4.1 mmol/L (ref 3.5–5.1)
Sodium: 138 mmol/L (ref 135–145)

## 2020-09-30 LAB — CBC
HCT: 42.2 % (ref 36.0–46.0)
Hemoglobin: 13.7 g/dL (ref 12.0–15.0)
MCH: 29.3 pg (ref 26.0–34.0)
MCHC: 32.5 g/dL (ref 30.0–36.0)
MCV: 90.2 fL (ref 80.0–100.0)
Platelets: 317 10*3/uL (ref 150–400)
RBC: 4.68 MIL/uL (ref 3.87–5.11)
RDW: 13.3 % (ref 11.5–15.5)
WBC: 11.9 10*3/uL — ABNORMAL HIGH (ref 4.0–10.5)
nRBC: 0 % (ref 0.0–0.2)

## 2020-09-30 LAB — T4, FREE: Free T4: 1.04 ng/dL (ref 0.61–1.12)

## 2020-09-30 LAB — TSH: TSH: 0.113 u[IU]/mL — ABNORMAL LOW (ref 0.350–4.500)

## 2020-09-30 MED ORDER — LOPERAMIDE HCL 2 MG PO CAPS
2.0000 mg | ORAL_CAPSULE | ORAL | Status: DC | PRN
  Administered 2020-09-30: 2 mg via ORAL
  Filled 2020-09-30: qty 1

## 2020-09-30 MED ORDER — OXYCODONE HCL 5 MG PO TABS
5.0000 mg | ORAL_TABLET | Freq: Four times a day (QID) | ORAL | Status: DC | PRN
  Administered 2020-09-30 (×2): 5 mg via ORAL
  Filled 2020-09-30 (×2): qty 1

## 2020-09-30 MED ORDER — XTAMPZA ER 18 MG PO C12A: 1.0000 | EXTENDED_RELEASE_CAPSULE | Freq: Every day | ORAL | 0 refills | Status: AC

## 2020-09-30 MED ORDER — OXYCODONE HCL 10 MG PO TABS
5.0000 mg | ORAL_TABLET | Freq: Four times a day (QID) | ORAL | 0 refills | Status: AC | PRN
Start: 2020-09-30 — End: ?

## 2020-09-30 MED ORDER — GABAPENTIN 600 MG PO TABS: 600.0000 mg | ORAL_TABLET | Freq: Three times a day (TID) | ORAL | Status: AC

## 2020-09-30 MED ORDER — TOUJEO MAX SOLOSTAR 300 UNIT/ML ~~LOC~~ SOPN: 50.0000 [IU] | PEN_INJECTOR | Freq: Every day | SUBCUTANEOUS | Status: AC

## 2020-09-30 NOTE — Discharge Instructions (Signed)
Follow with Primary MD Treasa School, PA-C in 7 days   Get CBC, CMP,  checked  by Primary MD next visit.    Activity: As tolerated with Full fall precautions use walker/cane & assistance as needed   Disposition Home    Diet: Regular diet    On your next visit with your primary care physician please Get Medicines reviewed and adjusted.   Please request your Prim.MD to go over all Hospital Tests and Procedure/Radiological results at the follow up, please get all Hospital records sent to your Prim MD by signing hospital release before you go home.   If you experience worsening of your admission symptoms, develop shortness of breath, life threatening emergency, suicidal or homicidal thoughts you must seek medical attention immediately by calling 911 or calling your MD immediately  if symptoms less severe.  You Must read complete instructions/literature along with all the possible adverse reactions/side effects for all the Medicines you take and that have been prescribed to you. Take any new Medicines after you have completely understood and accpet all the possible adverse reactions/side effects.   Do not drive, operating heavy machinery, perform activities at heights, swimming or participation in water activities or provide baby sitting services if your were admitted for syncope or siezures until you have seen by Primary MD or a Neurologist and advised to do so again.  Do not drive when taking Pain medications.    Do not take more than prescribed Pain, Sleep and Anxiety Medications  Special Instructions: If you have smoked or chewed Tobacco  in the last 2 yrs please stop smoking, stop any regular Alcohol  and or any Recreational drug use.  Wear Seat belts while driving.   Please note  You were cared for by a hospitalist during your hospital stay. If you have any questions about your discharge medications or the care you received while you were in the hospital after you are  discharged, you can call the unit and asked to speak with the hospitalist on call if the hospitalist that took care of you is not available. Once you are discharged, your primary care physician will handle any further medical issues. Please note that NO REFILLS for any discharge medications will be authorized once you are discharged, as it is imperative that you return to your primary care physician (or establish a relationship with a primary care physician if you do not have one) for your aftercare needs so that they can reassess your need for medications and monitor your lab values.

## 2020-09-30 NOTE — Progress Notes (Signed)
PHARMACY - PHYSICIAN COMMUNICATION CRITICAL VALUE ALERT - BLOOD CULTURE IDENTIFICATION (BCID)  Jennifer Beltran is an 45 y.o. female who presented to [facility] on 09/28/2020 with a chief complaint of AMS, possible opiate overdose  Assessment:    1/2 blood culture growing Staph epidermidis. Pt afebrile. Likely contaminant  Name of physician (or Provider) Contacted: Dr. Arsenio Loader  Current antibiotics: None  Changes to prescribed antibiotics recommended:  None at this time  Results for orders placed or performed during the hospital encounter of 09/28/20  Blood Culture ID Panel (Reflexed) (Collected: 09/28/2020  6:00 PM)  Result Value Ref Range   Enterococcus faecalis NOT DETECTED NOT DETECTED   Enterococcus Faecium NOT DETECTED NOT DETECTED   Listeria monocytogenes NOT DETECTED NOT DETECTED   Staphylococcus species DETECTED (A) NOT DETECTED   Staphylococcus aureus (BCID) NOT DETECTED NOT DETECTED   Staphylococcus epidermidis DETECTED (A) NOT DETECTED   Staphylococcus lugdunensis NOT DETECTED NOT DETECTED   Streptococcus species NOT DETECTED NOT DETECTED   Streptococcus agalactiae NOT DETECTED NOT DETECTED   Streptococcus pneumoniae NOT DETECTED NOT DETECTED   Streptococcus pyogenes NOT DETECTED NOT DETECTED   A.calcoaceticus-baumannii NOT DETECTED NOT DETECTED   Bacteroides fragilis NOT DETECTED NOT DETECTED   Enterobacterales NOT DETECTED NOT DETECTED   Enterobacter cloacae complex NOT DETECTED NOT DETECTED   Escherichia coli NOT DETECTED NOT DETECTED   Klebsiella aerogenes NOT DETECTED NOT DETECTED   Klebsiella oxytoca NOT DETECTED NOT DETECTED   Klebsiella pneumoniae NOT DETECTED NOT DETECTED   Proteus species NOT DETECTED NOT DETECTED   Salmonella species NOT DETECTED NOT DETECTED   Serratia marcescens NOT DETECTED NOT DETECTED   Haemophilus influenzae NOT DETECTED NOT DETECTED   Neisseria meningitidis NOT DETECTED NOT DETECTED   Pseudomonas aeruginosa NOT DETECTED NOT DETECTED    Stenotrophomonas maltophilia NOT DETECTED NOT DETECTED   Candida albicans NOT DETECTED NOT DETECTED   Candida auris NOT DETECTED NOT DETECTED   Candida glabrata NOT DETECTED NOT DETECTED   Candida krusei NOT DETECTED NOT DETECTED   Candida parapsilosis NOT DETECTED NOT DETECTED   Candida tropicalis NOT DETECTED NOT DETECTED   Cryptococcus neoformans/gattii NOT DETECTED NOT DETECTED   Methicillin resistance mecA/C DETECTED (A) NOT DETECTED    Eddie Candle 09/30/2020  4:11 AM

## 2020-09-30 NOTE — Discharge Summary (Addendum)
Jennifer Beltran, is a 45 y.o. female  DOB 05-Oct-1975  MRN [id number].  Admission date:  09/28/2020  Admitting Physician  Charlotte SanesNicole Gonzales, MD  Discharge Date:  09/30/2020   Primary MD  Treasa SchoolLand, Phillip, PA-C  Recommendations for primary care physician for things to follow:  -Try to minimize her narcotics -Patient will need thyroid ultrasound for further evaluation for left thyroid nodule measuring 16 mm finding on CT chest. -Low TSH, most likely due to sick euthyroid syndrome, free T4 within normal limits, repeat as an outpatient.  Admission Diagnosis  Overdose [T50.901A] Accidental drug overdose, initial encounter [T50.901A] Overdose opiate (HCC) [T40.601A]   Discharge Diagnosis  Overdose [T50.901A] Accidental drug overdose, initial encounter [T50.901A] Overdose opiate (HCC) [T40.601A]    Active Problems:   Overdose   Overdose opiate (HCC)      Past Medical History:  Diagnosis Date  . Diabetes mellitus without complication (HCC)   . Fibromyalgia   . Lupus (HCC)   . Sciatica     Past Surgical History:  Procedure Laterality Date  . ABDOMINAL HYSTERECTOMY    . CHOLECYSTECTOMY    . TONSILLECTOMY    . TUBAL LIGATION         History of present illness and  Hospital Course:     Kindly see H&P for history of present illness and admission details, please review complete Labs, Consult reports and Test reports for all details in brief  HPI  from the history and physical done on the day of admission 09/29/2020  45 year old woman with DM, fibromyalgia, lupus, sciatica here with fatigue.  Difficult to arouse in ED.  Also found to be hypoxic.   Found slumped over in wheelchair, minimally arousable.   Sat 87%.   Unclear if she has been taking more opiates.  Notably has AKI.   Recent COVID 19.   Hospital Course   AMS/ Acute encephalopathy:  - appears 2/2 opiates given her improvement With  narcan.  Her mentation back to baseline, no hypoxia, Requirement, awake alert appropriate, with steady gait. -Cussed with her the need to decrease her opiates, I have been instructed her to decrease her metoprolol to 5 mg every 6 hours as needed, and to decrease xtampza to once daily.  To decrease gabapentin by 50%.  Hypertension -Resume lisinopril  Diabetes mellitus -CBG is controlled, so instructed to hold glipizide on discharge and I will decrease her Toujeo by 50%  Incidental finding of thyroid nodule -Patient will need thyroid ultrasound for further evaluation for left thyroid nodule measuring 16 mm finding on CT chest. -TSH is low at 0.270, but free T4 within normal limit, most likely sick euthyroid syndrome, please repeat as an outpatient.  recetn hx of COVID (3-4 weeks ago) 1/26  Discharge Condition:  stable   Follow UP   Follow-up Information    Treasa SchoolLand, Phillip, PA-C Follow up in 10 day(s).   Specialty: Physician Field seismologistAssistant Contact information: [address] [place] [id number] [phone]  Discharge Instructions  and  Discharge Medications    Discharge Instructions    Discharge instructions   Complete by: As directed    Follow with Primary MD Treasa School, PA-C in 7 days   Get CBC, CMP,  checked  by Primary MD next visit.    Activity: As tolerated with Full fall precautions use walker/cane & assistance as needed   Disposition Home    Diet: heart Healthy/carbohydrate modified    On your next visit with your primary care physician please Get Medicines reviewed and adjusted.   Please request your Prim.MD to go over all Hospital Tests and Procedure/Radiological results at the follow up, please get all Hospital records sent to your Prim MD by signing hospital release before you go home.   If you experience worsening of your admission symptoms, develop shortness of breath, life threatening emergency, suicidal or homicidal  thoughts you must seek medical attention immediately by calling 911 or calling your MD immediately  if symptoms less severe.  You Must read complete instructions/literature along with all the possible adverse reactions/side effects for all the Medicines you take and that have been prescribed to you. Take any new Medicines after you have completely understood and accpet all the possible adverse reactions/side effects.   Do not drive, operating heavy machinery, perform activities at heights, swimming or participation in water activities or provide baby sitting services if your were admitted for syncope or siezures until you have seen by Primary MD or a Neurologist and advised to do so again.  Do not drive when taking Pain medications.    Do not take more than prescribed Pain, Sleep and Anxiety Medications  Special Instructions: If you have smoked or chewed Tobacco  in the last 2 yrs please stop smoking, stop any regular Alcohol  and or any Recreational drug use.  Wear Seat belts while driving.   Please note  You were cared for by a hospitalist during your hospital stay. If you have any questions about your discharge medications or the care you received while you were in the hospital after you are discharged, you can call the unit and asked to speak with the hospitalist on call if the hospitalist that took care of you is not available. Once you are discharged, your primary care physician will handle any further medical issues. Please note that NO REFILLS for any discharge medications will be authorized once you are discharged, as it is imperative that you return to your primary care physician (or establish a relationship with a primary care physician if you do not have one) for your aftercare needs so that they can reassess your need for medications and monitor your lab values.   Increase activity slowly   Complete by: As directed      Allergies as of 09/30/2020      Reactions   Penicillins  Hives   Monistat [tioconazole] Rash   Morphine Itching      Medication List    STOP taking these medications   glipiZIDE 10 MG tablet Commonly known as: GLUCOTROL   ibuprofen 800 MG tablet Commonly known as: ADVIL     TAKE these medications   baclofen 10 MG tablet Commonly known as: LIORESAL Take 10 mg by mouth 3 (three) times daily as needed for muscle spasms.   DULoxetine 60 MG capsule Commonly known as: CYMBALTA Take 60 mg by mouth at bedtime.   gabapentin 600 MG tablet Commonly known as: NEURONTIN Take 1 tablet (600 mg total) by  mouth 3 (three) times daily. What changed: how much to take   lidocaine 5 % Commonly known as: LIDODERM Place 1 patch onto the skin daily as needed for pain.   lisinopril 2.5 MG tablet Commonly known as: ZESTRIL Take 2.5 mg by mouth daily.   ondansetron 8 MG disintegrating tablet Commonly known as: ZOFRAN-ODT Take 8 mg by mouth 3 (three) times daily as needed for nausea/vomiting.   Oxycodone HCl 10 MG Tabs Take 0.5 tablets (5 mg total) by mouth every 6 (six) hours as needed. What changed:   how much to take  when to take this  reasons to take this   rOPINIRole 0.25 MG tablet Commonly known as: REQUIP Take 0.25 mg by mouth at bedtime as needed. Restless legs   Toujeo Max SoloStar 300 UNIT/ML Solostar Pen Generic drug: insulin glargine (2 Unit Dial) Inject 50 Units into the skin at bedtime. What changed: how much to take   Vitamin D (Ergocalciferol) 1.25 MG (50000 UNIT) Caps capsule Commonly known as: DRISDOL Take 50,000 Units by mouth every Saturday.   Xtampza ER 18 MG C12a Generic drug: oxyCODONE ER Take 1 capsule by mouth daily. What changed: when to take this         Diet and Activity recommendation: See Discharge Instructions above   Consults obtained -  PCCM   Major procedures and Radiology Reports - PLEASE review detailed and final reports for all details, in brief -      CT Head Wo  Contrast  Result Date: 09/28/2020 CLINICAL DATA:  Pt brought in by EMS [facility]- After registration pt noted to be slumped over in wheelchair. Arousable to shaking but then drifts off and closes eyes. She states she has been fatigued since dx with covid in January. O2 sats 87% on room air. Pt taken to 11 to complete triage and placed on 2L Agency EXAM: CT HEAD WITHOUT CONTRAST TECHNIQUE: Contiguous axial images were obtained from the base of the skull through the vertex without intravenous contrast. COMPARISON:  02/13/2016 FINDINGS: Brain: No evidence of acute infarction, hemorrhage, hydrocephalus, extra-axial collection or mass lesion/mass effect. Vascular: No hyperdense vessel or unexpected calcification. Skull: Normal. Negative for fracture or focal lesion. Sinuses/Orbits: Normal globes and orbits. Visualized sinuses are clear Other: None. IMPRESSION: Normal unenhanced CT scan of the brain. Electronically Signed   By: Amie Portland M.D.   On: 09/28/2020 19:56   CT Angio Chest PE W/Cm &/Or Wo Cm  Result Date: 09/28/2020 CLINICAL DATA:  PE suspected, high prob Shortness of breath.  COVID positive last month. EXAM: CT ANGIOGRAPHY CHEST WITH CONTRAST TECHNIQUE: Multidetector CT imaging of the chest was performed using the standard protocol during bolus administration of intravenous contrast. Multiplanar CT image reconstructions and MIPs were obtained to evaluate the vascular anatomy. CONTRAST:  OMNIPAQUE IOHEXOL 350 MG/ML SOLN COMPARISON:  Radiograph earlier today. FINDINGS: Cardiovascular: There are no filling defects within the pulmonary arteries to suggest pulmonary embolus. Subsegmental branches are not well assessed due to contrast bolus timing. Normal caliber thoracic aorta without dissection or acute aortic findings. Heart is normal in size. No pericardial effusion. Mediastinum/Nodes: No enlarged mediastinal or hilar lymph nodes. There is a 16 mm hypodense left thyroid nodule. Tiny hiatal hernia,  no esophageal wall thickening. Lungs/Pleura: Multifocal vague areas of ground-glass opacity throughout both lungs with occasional areas of septal thickening. Trachea and central bronchi are patent. No pleural fluid. Upper Abdomen: No acute or unexpected findings. Musculoskeletal: There are no acute or suspicious  osseous abnormalities. Mild thoracic spondylosis Review of the MIP images confirms the above findings. IMPRESSION: 1. No pulmonary embolus. 2. Multifocal vague areas of ground-glass opacity throughout both lungs with occasional areas of septal thickening. Findings likely sequela of COVID-19 pneumonia. Pulmonary edema is also considered but felt mild less likely. 3. Left thyroid nodule measuring 16 mm. Recommend thyroid US on a nonemergent elective basis. (Ref: J Am Coll Radiol. 2015 Feb;12(2): 143-50). Electronically Signed   By: Narda Rutherford M.D.   On: 09/28/2020 20:01   DG Chest Port 1 View  Result Date: 09/28/2020 CLINICAL DATA:  Altered mental status and headache. Shortness of breath EXAM: PORTABLE CHEST 1 VIEW COMPARISON:  01/16/2020 FINDINGS: Low lung volumes are present, causing crowding of the pulmonary vasculature. Upper normal heart size. The lungs appear clear.  No blunting of the costophrenic angles. IMPRESSION: 1. Low lung volumes.  No acute findings. Electronically Signed   By: Gaylyn Rong M.D.   On: 09/28/2020 18:39    Micro Results     Recent Results (from the past 240 hour(s))  Culture, blood (Routine x 2)     Status: None (Preliminary result)   Collection Time: 09/28/20  6:00 PM   Specimen: Left Antecubital; Blood  Result Value Ref Range Status   Specimen Description   Final    LEFT ANTECUBITAL Blood Culture adequate volume Performed at [facility], [address] [place], [place] [address]    Special Requests   Final    BOTTLES DRAWN AEROBIC AND ANAEROBIC Performed at [facility], [address] [place], [place] [address]     Culture  Setup Time   Final    ANAEROBIC BOTTLE ONLY GRAM POSITIVE COCCI CRITICAL RESULT CALLED TO, READ BACK BY AND VERIFIED WITH: G,ABBOTT @0358  09/30/20 JW Performed at [facility], [address] [place], [place]    Culture GRAM POSITIVE COCCI  Final   Report Status PENDING  Incomplete  Culture, blood (Routine x 2)     Status: None (Preliminary result)   Collection Time: 09/28/20  6:00 PM   Specimen: BLOOD RIGHT HAND  Result Value Ref Range Status   Specimen Description   Final    BLOOD RIGHT HAND Blood Culture results may not be optimal due to an inadequate volume of blood received in culture bottles Performed at [facility], [address] [place], [place]    Special Requests   Final    BOTTLES DRAWN AEROBIC AND ANAEROBIC Performed at [facility], [address] [place], [place]    Culture   Final    NO GROWTH < 24 HOURS Performed at [facility], [address] [place], [place]    Report Status PENDING  Incomplete  Blood Culture ID Panel (Reflexed)     Status: Abnormal   Collection Time: 09/28/20  6:00 PM  Result Value Ref Range Status   Enterococcus faecalis NOT DETECTED NOT DETECTED Final   Enterococcus Faecium NOT DETECTED NOT DETECTED Final   Listeria monocytogenes NOT DETECTED NOT DETECTED Final   Staphylococcus species DETECTED (A) NOT DETECTED Final    Comment: CRITICAL RESULT CALLED TO, READ BACK BY AND VERIFIED WITH: G ABBOTT PHARMD 09/30/20 0358 JDW    Staphylococcus aureus (BCID) NOT DETECTED NOT DETECTED Final   Staphylococcus epidermidis DETECTED (A) NOT DETECTED Final    Comment: Methicillin (oxacillin) resistant coagulase negative staphylococcus. Possible blood  culture contaminant (unless isolated from more than one blood culture draw or clinical case suggests pathogenicity). No antibiotic treatment is indicated for blood  culture contaminants. CRITICAL RESULT CALLED TO, READ  BACK BY AND VERIFIED WITH: G ABBOTT PHARMD 09/30/20 0358 JDW    Staphylococcus lugdunensis NOT DETECTED NOT DETECTED Final   Streptococcus species NOT DETECTED NOT DETECTED Final   Streptococcus agalactiae NOT DETECTED NOT DETECTED Final   Streptococcus pneumoniae NOT DETECTED NOT DETECTED Final   Streptococcus pyogenes NOT DETECTED NOT DETECTED Final   A.calcoaceticus-baumannii NOT DETECTED NOT DETECTED Final   Bacteroides fragilis NOT DETECTED NOT DETECTED Final   Enterobacterales NOT DETECTED NOT DETECTED Final   Enterobacter cloacae complex NOT DETECTED NOT DETECTED Final   Escherichia coli NOT DETECTED NOT DETECTED Final   Klebsiella aerogenes NOT DETECTED NOT DETECTED Final   Klebsiella oxytoca NOT DETECTED NOT DETECTED Final   Klebsiella pneumoniae NOT DETECTED NOT DETECTED Final   Proteus species NOT DETECTED NOT DETECTED Final   Salmonella species NOT DETECTED NOT DETECTED Final   Serratia marcescens NOT DETECTED NOT DETECTED Final   Haemophilus influenzae NOT DETECTED NOT DETECTED Final   Neisseria meningitidis NOT DETECTED NOT DETECTED Final   Pseudomonas aeruginosa NOT DETECTED NOT DETECTED Final   Stenotrophomonas maltophilia NOT DETECTED NOT DETECTED Final   Candida albicans NOT DETECTED NOT DETECTED Final   Candida auris NOT DETECTED NOT DETECTED Final   Candida glabrata NOT DETECTED NOT DETECTED Final   Candida krusei NOT DETECTED NOT DETECTED Final   Candida parapsilosis NOT DETECTED NOT DETECTED Final   Candida tropicalis NOT DETECTED NOT DETECTED Final   Cryptococcus neoformans/gattii NOT DETECTED NOT DETECTED Final   Methicillin resistance mecA/C DETECTED (A) NOT DETECTED Final    Comment: CRITICAL RESULT CALLED TO, READ BACK BY AND VERIFIED WITH: Evelena Peat [facility] 09/30/20 0358 JDW Performed at [facility], [address] [place], [place] [address]   Resp Panel by RT-PCR (Flu A&B, Covid) Nasopharyngeal Swab     Status: Abnormal   Collection Time: 09/28/20   8:20 PM   Specimen: Nasopharyngeal Swab; Nasopharyngeal(NP) swabs in vial transport medium  Result Value Ref Range Status   SARS Coronavirus 2 by RT PCR POSITIVE (A) NEGATIVE Final    Comment: RESULT CALLED TO, READ BACK BY AND VERIFIED WITH: MAYNARD,C AT 2130 ON [id number] BY CHERESNOWSKY,T (NOTE) SARS-CoV-2 target nucleic acids are DETECTED.  The SARS-CoV-2 RNA is generally detectable in upper respiratory specimens during the acute phase of infection. Positive results are indicative of the presence of the identified virus, but do not rule out bacterial infection or co-infection with other pathogens not detected by the test. Clinical correlation with patient history and other diagnostic information is necessary to determine patient infection status. The expected result is Negative.  Fact Sheet for Patients: BloggerCourse.com  Fact Sheet for Healthcare Providers: SeriousBroker.it  This test is not yet approved or cleared by the [place] FDA and  has been authorized for detection and/or diagnosis of SARS-CoV-2 by FDA under an Emergency Use Authorization (EUA).  This EUA will remain in effect (meaning this  test can be used) for the duration of  the COVID-19 declaration under Section 564(b)(1) of the Act, 21 U.S.C. section 360bbb-3(b)(1), unless the authorization is terminated or revoked sooner.     Influenza A by PCR NEGATIVE NEGATIVE Final   Influenza B by PCR NEGATIVE NEGATIVE Final    Comment: (NOTE) The Xpert Xpress SARS-CoV-2/FLU/RSV plus assay is intended as an aid in  the diagnosis of influenza from Nasopharyngeal swab specimens and should not be used as a sole basis for treatment. Nasal washings and aspirates are unacceptable for Xpert Xpress SARS-CoV-2/FLU/RSV testing.  Fact Sheet for Patients: BloggerCourse.com  Fact Sheet for Healthcare  Providers: SeriousBroker.it  This test is not yet approved or cleared by the [place] FDA and has been authorized for detection and/or diagnosis of SARS-CoV-2 by FDA under an Emergency Use Authorization (EUA). This EUA will remain in effect (meaning this test can be used) for the duration of the COVID-19 declaration under Section 564(b)(1) of the Act, 21 U.S.C. section 360bbb-3(b)(1), unless the authorization is terminated or revoked.  Performed at [facility], [address] [place], [place] [address]   MRSA PCR Screening     Status: None   Collection Time: 09/29/20  1:50 AM   Specimen: Nasal Mucosa; Nasopharyngeal  Result Value Ref Range Status   MRSA by PCR NEGATIVE NEGATIVE Final    Comment:        The GeneXpert MRSA Assay (FDA approved for NASAL specimens only), is one component of a comprehensive MRSA colonization surveillance program. It is not intended to diagnose MRSA infection nor to guide or monitor treatment for MRSA infections. Performed at [facility], [address], [place], [place] [address]        Today   Subjective:   Jennifer Beltran today has no headache,no chest abdominal pain, he ambulated in the hallway with staff with no hypoxia. Objective:   Blood pressure (!) 117/58, pulse 70, temperature 98.1 F (36.7 C), temperature source Axillary, resp. rate 18, height  (1.626 m), weight 92.8 kg, SpO2 98 %.   Intake/Output Summary (Last 24 hours) at 09/30/2020 1457 Last data filed at 09/29/2020 2020 Gross per 24 hour  Intake --  Output 573 ml  Net -573 ml    Exam Awake Alert, Oriented x 3, No new F.N deficits, Normal affect Symmetrical Chest wall movement, Good air movement bilaterally, CTAB RRR,No Gallops,Rubs or new Murmurs, No Parasternal Heave +ve B.Sounds, Abd Soft, Non tender, No organomegaly appriciated, No rebound -guarding or rigidity. No Cyanosis, Clubbing or edema, No new Rash or  bruise  Data Review   CBC w Diff:  Lab Results  Component Value Date   WBC 11.9 (H) 09/30/2020   HGB 13.7 09/30/2020   HCT 42.2 09/30/2020   PLT 317 09/30/2020   LYMPHOPCT 17 09/28/2020   MONOPCT 6 09/28/2020   EOSPCT 1 09/28/2020   BASOPCT 0 09/28/2020    CMP:  Lab Results  Component Value Date   NA 138 09/30/2020   K 4.1 09/30/2020   CL 105 09/30/2020   CO2 19 (L) 09/30/2020   BUN 12 09/30/2020   CREATININE 0.45 09/30/2020   PROT 7.5 09/28/2020   ALBUMIN 3.7 09/28/2020   BILITOT 0.4 09/28/2020   ALKPHOS 99 09/28/2020   AST 30 09/28/2020   ALT 26 09/28/2020  .   Total Time in preparing paper work, data evaluation and todays exam - 35 minutes  Huey Bienenstock M.D on 09/30/2020 at 2:57 PM  [facility]   Office  [phone]

## 2020-09-30 NOTE — Progress Notes (Addendum)
Patient was discharged home from [phone] via wheelchair. Patient is ambulatory and on room air.Skin intact and peripheral IV discontinued. AVS provided, no equipment, prescriptions, or medications were sent with the patient. Patient left will all of her belongings including cell phone with charger, clothing, shoes, and 3 bags.

## 2020-09-30 NOTE — Progress Notes (Signed)
SATURATION QUALIFICATIONS: (This note is used to comply with regulatory documentation for home oxygen)  Patient Saturations on Room Air at Rest = 100%  Patient Saturations on Room Air while Ambulating = 96%  Patient did not require oxygen while ambulating.

## 2020-10-03 LAB — CULTURE, BLOOD (ROUTINE X 2)
Culture: NO GROWTH
Specimen Description: ADEQUATE
